# Patient Record
Sex: Female | Born: 1940 | Race: White | Hispanic: No | Marital: Married | Smoking: Never smoker
Health system: Southern US, Community
[De-identification: ages and names within clinical notes are randomized; demographics above are authoritative.]

## PROBLEM LIST (undated history)

## (undated) DIAGNOSIS — C801 Malignant (primary) neoplasm, unspecified: Secondary | ICD-10-CM

## (undated) DIAGNOSIS — K219 Gastro-esophageal reflux disease without esophagitis: Secondary | ICD-10-CM

## (undated) DIAGNOSIS — E039 Hypothyroidism, unspecified: Secondary | ICD-10-CM

## (undated) DIAGNOSIS — I10 Essential (primary) hypertension: Secondary | ICD-10-CM

## (undated) DIAGNOSIS — Z8719 Personal history of other diseases of the digestive system: Secondary | ICD-10-CM

## (undated) DIAGNOSIS — Z9889 Other specified postprocedural states: Secondary | ICD-10-CM

## (undated) DIAGNOSIS — E785 Hyperlipidemia, unspecified: Secondary | ICD-10-CM

## (undated) DIAGNOSIS — R7303 Prediabetes: Secondary | ICD-10-CM

## (undated) DIAGNOSIS — J189 Pneumonia, unspecified organism: Secondary | ICD-10-CM

## (undated) DIAGNOSIS — R112 Nausea with vomiting, unspecified: Secondary | ICD-10-CM

## (undated) DIAGNOSIS — M48061 Spinal stenosis, lumbar region without neurogenic claudication: Secondary | ICD-10-CM

## (undated) DIAGNOSIS — R059 Cough, unspecified: Secondary | ICD-10-CM

## (undated) DIAGNOSIS — R05 Cough: Secondary | ICD-10-CM

## (undated) DIAGNOSIS — I82409 Acute embolism and thrombosis of unspecified deep veins of unspecified lower extremity: Secondary | ICD-10-CM

## (undated) DIAGNOSIS — M1990 Unspecified osteoarthritis, unspecified site: Secondary | ICD-10-CM

## (undated) DIAGNOSIS — N390 Urinary tract infection, site not specified: Secondary | ICD-10-CM

## (undated) HISTORY — PX: BREAST BIOPSY: SHX20

## (undated) HISTORY — PX: BREAST SURGERY: SHX581

## (undated) HISTORY — PX: APPENDECTOMY: SHX54

## (undated) HISTORY — PX: JOINT REPLACEMENT: SHX530

## (undated) HISTORY — PX: EYE SURGERY: SHX253

## (undated) HISTORY — PX: DILATION AND CURETTAGE OF UTERUS: SHX78

## (undated) HISTORY — PX: ABDOMINAL HYSTERECTOMY: SHX81

## (undated) HISTORY — PX: COLONOSCOPY: SHX174

---

## 2004-11-23 ENCOUNTER — Ambulatory Visit: Payer: Self-pay | Admitting: Internal Medicine

## 2005-02-10 ENCOUNTER — Ambulatory Visit: Payer: Self-pay | Admitting: Internal Medicine

## 2006-03-01 ENCOUNTER — Ambulatory Visit: Payer: Self-pay | Admitting: Internal Medicine

## 2007-03-13 ENCOUNTER — Ambulatory Visit: Payer: Self-pay | Admitting: Internal Medicine

## 2008-03-17 ENCOUNTER — Ambulatory Visit: Payer: Self-pay | Admitting: Internal Medicine

## 2008-04-07 ENCOUNTER — Ambulatory Visit: Payer: Self-pay | Admitting: Gastroenterology

## 2009-04-09 ENCOUNTER — Ambulatory Visit: Payer: Self-pay | Admitting: Internal Medicine

## 2010-04-12 ENCOUNTER — Ambulatory Visit: Payer: Self-pay | Admitting: Internal Medicine

## 2011-06-22 ENCOUNTER — Ambulatory Visit: Payer: Self-pay | Admitting: Internal Medicine

## 2012-06-24 ENCOUNTER — Ambulatory Visit: Payer: Self-pay | Admitting: Internal Medicine

## 2013-06-27 ENCOUNTER — Ambulatory Visit: Payer: Self-pay | Admitting: Internal Medicine

## 2014-06-03 ENCOUNTER — Other Ambulatory Visit: Payer: Self-pay | Admitting: Physical Medicine and Rehabilitation

## 2014-06-03 DIAGNOSIS — M5116 Intervertebral disc disorders with radiculopathy, lumbar region: Secondary | ICD-10-CM | POA: Insufficient documentation

## 2014-06-03 DIAGNOSIS — M5136 Other intervertebral disc degeneration, lumbar region: Secondary | ICD-10-CM | POA: Insufficient documentation

## 2014-06-03 DIAGNOSIS — M5416 Radiculopathy, lumbar region: Secondary | ICD-10-CM

## 2014-06-12 ENCOUNTER — Other Ambulatory Visit: Payer: Self-pay

## 2014-06-17 ENCOUNTER — Ambulatory Visit
Admission: RE | Admit: 2014-06-17 | Discharge: 2014-06-17 | Disposition: A | Discharge status: Home / Self Care | Payer: 59 | Source: Ambulatory Visit | Attending: Physical Medicine and Rehabilitation | Admitting: Physical Medicine and Rehabilitation

## 2014-06-17 DIAGNOSIS — M5416 Radiculopathy, lumbar region: Secondary | ICD-10-CM

## 2014-06-23 DIAGNOSIS — M48061 Spinal stenosis, lumbar region without neurogenic claudication: Secondary | ICD-10-CM | POA: Insufficient documentation

## 2014-08-03 ENCOUNTER — Ambulatory Visit: Payer: Self-pay | Admitting: Internal Medicine

## 2014-08-31 ENCOUNTER — Other Ambulatory Visit: Payer: Self-pay | Admitting: Neurosurgery

## 2014-08-31 DIAGNOSIS — M5416 Radiculopathy, lumbar region: Secondary | ICD-10-CM

## 2014-09-03 ENCOUNTER — Ambulatory Visit
Admission: RE | Admit: 2014-09-03 | Discharge: 2014-09-03 | Disposition: A | Discharge status: Home / Self Care | Payer: PRIVATE HEALTH INSURANCE | Source: Ambulatory Visit | Attending: Neurosurgery | Admitting: Neurosurgery

## 2014-09-03 DIAGNOSIS — M5416 Radiculopathy, lumbar region: Secondary | ICD-10-CM

## 2014-09-03 MED ORDER — MEPERIDINE HCL 100 MG/ML IJ SOLN
50.0000 mg | Freq: Once | INTRAMUSCULAR | Status: AC
  Administered 2014-09-03: 50 mg via INTRAMUSCULAR

## 2014-09-03 MED ORDER — IOHEXOL 180 MG/ML  SOLN
15.0000 mL | Freq: Once | INTRAMUSCULAR | Status: AC | PRN
  Administered 2014-09-03: 15 mL via INTRATHECAL

## 2014-09-03 MED ORDER — ONDANSETRON HCL 4 MG/2ML IJ SOLN
4.0000 mg | Freq: Once | INTRAMUSCULAR | Status: AC
  Administered 2014-09-03: 4 mg via INTRAMUSCULAR

## 2014-09-03 MED ORDER — DIAZEPAM 5 MG PO TABS
5.0000 mg | ORAL_TABLET | Freq: Once | ORAL | Status: AC
  Administered 2014-09-03: 5 mg via ORAL

## 2014-09-03 NOTE — Discharge Instructions (Signed)

## 2014-09-04 ENCOUNTER — Telehealth: Payer: Self-pay | Admitting: Radiology

## 2014-09-04 NOTE — Telephone Encounter (Signed)
Pt had myelo yesterday and wanted to know if it was all right for her to be up and doing as long as she didn't have a headache. Also, wanted to know if it was okay to remove her bandaid.  I told her yes on both counts.

## 2014-09-14 ENCOUNTER — Other Ambulatory Visit: Payer: Self-pay | Admitting: Neurosurgery

## 2014-10-07 ENCOUNTER — Other Ambulatory Visit (HOSPITAL_COMMUNITY): Payer: Self-pay | Admitting: *Deleted

## 2014-10-07 ENCOUNTER — Encounter (HOSPITAL_COMMUNITY)
Admission: RE | Admit: 2014-10-07 | Discharge: 2014-10-07 | Disposition: A | Discharge status: Home / Self Care | Payer: Medicare Other | Source: Ambulatory Visit | Attending: Neurosurgery | Admitting: Neurosurgery

## 2014-10-07 ENCOUNTER — Encounter (HOSPITAL_COMMUNITY): Payer: Self-pay

## 2014-10-07 DIAGNOSIS — R001 Bradycardia, unspecified: Secondary | ICD-10-CM | POA: Diagnosis not present

## 2014-10-07 DIAGNOSIS — Z86718 Personal history of other venous thrombosis and embolism: Secondary | ICD-10-CM | POA: Insufficient documentation

## 2014-10-07 DIAGNOSIS — R9431 Abnormal electrocardiogram [ECG] [EKG]: Secondary | ICD-10-CM | POA: Insufficient documentation

## 2014-10-07 DIAGNOSIS — M4800 Spinal stenosis, site unspecified: Secondary | ICD-10-CM | POA: Diagnosis not present

## 2014-10-07 DIAGNOSIS — Z01812 Encounter for preprocedural laboratory examination: Secondary | ICD-10-CM | POA: Insufficient documentation

## 2014-10-07 DIAGNOSIS — I10 Essential (primary) hypertension: Secondary | ICD-10-CM | POA: Diagnosis not present

## 2014-10-07 DIAGNOSIS — E785 Hyperlipidemia, unspecified: Secondary | ICD-10-CM | POA: Diagnosis not present

## 2014-10-07 DIAGNOSIS — E039 Hypothyroidism, unspecified: Secondary | ICD-10-CM | POA: Insufficient documentation

## 2014-10-07 HISTORY — DX: Acute embolism and thrombosis of unspecified deep veins of unspecified lower extremity: I82.409

## 2014-10-07 HISTORY — DX: Malignant (primary) neoplasm, unspecified: C80.1

## 2014-10-07 HISTORY — DX: Hyperlipidemia, unspecified: E78.5

## 2014-10-07 HISTORY — DX: Unspecified osteoarthritis, unspecified site: M19.90

## 2014-10-07 HISTORY — DX: Other specified postprocedural states: R11.2

## 2014-10-07 HISTORY — DX: Other specified postprocedural states: Z98.890

## 2014-10-07 HISTORY — DX: Essential (primary) hypertension: I10

## 2014-10-07 HISTORY — DX: Urinary tract infection, site not specified: N39.0

## 2014-10-07 HISTORY — DX: Hypothyroidism, unspecified: E03.9

## 2014-10-07 LAB — BASIC METABOLIC PANEL
ANION GAP: 10 (ref 5–15)
BUN: 19 mg/dL (ref 6–23)
CALCIUM: 9.6 mg/dL (ref 8.4–10.5)
CHLORIDE: 106 mmol/L (ref 96–112)
CO2: 25 mmol/L (ref 19–32)
CREATININE: 0.67 mg/dL (ref 0.50–1.10)
GFR calc non Af Amer: 85 mL/min — ABNORMAL LOW (ref >90)
Glucose, Bld: 99 mg/dL (ref 70–99)
Potassium: 4.2 mmol/L (ref 3.5–5.1)
Sodium: 141 mmol/L (ref 135–145)

## 2014-10-07 LAB — CBC
HCT: 38.3 % (ref 36.0–46.0)
Hemoglobin: 12.6 g/dL (ref 12.0–15.0)
MCH: 31.4 pg (ref 26.0–34.0)
MCHC: 32.9 g/dL (ref 30.0–36.0)
MCV: 95.5 fL (ref 78.0–100.0)
PLATELETS: 255 10*3/uL (ref 150–400)
RBC: 4.01 MIL/uL (ref 3.87–5.11)
RDW: 12.3 % (ref 11.5–15.5)
WBC: 8.9 10*3/uL (ref 4.0–10.5)

## 2014-10-07 LAB — SURGICAL PCR SCREEN
MRSA, PCR: NEGATIVE
STAPHYLOCOCCUS AUREUS: NEGATIVE

## 2014-10-07 NOTE — Pre-Procedure Instructions (Signed)
KYLINN SHROPSHIRE  10/07/2014   Your procedure is scheduled on:  Thursday, October 15, 2014 at 7:30 AM.   Report to [facility] Entrance "A" Admitting Office at 5:30 AM.   Call this number if you have problems the morning of surgery: [phone]               Any questions prior to day of surgery, please call [phone] between 8 & 4 PM.   Remember:   Do not eat food or drink liquids after midnight Wednesday, 10/14/14.   Take these medicines the morning of surgery with A SIP OF WATER: Gabapentin (Neurontin), Levothyroxine (Synthroid), Hydrocodone (Vicodin) - if needed.  Stop Aspirin, Fish oil, Herbal Medications and all Vitamins as of today. Do not use NSAIDS (Ibuprofen, Aleve, etc) prior to surgery.   Do not wear jewelry, make-up or nail polish.  Do not wear lotions, powders, or perfumes. You may wear deodorant.  Do not shave 48 hours prior to surgery.   Do not bring valuables to the hospital.  [facility] is not responsible                  for any belongings or valuables.               Contacts, dentures or bridgework may not be worn into surgery.  Leave suitcase in the car. After surgery it may be brought to your room.  For patients admitted to the hospital, discharge time is determined by your                treatment team.               Patients discharged the day of surgery will not be allowed to drive home.    Special Instructions: [place] - Preparing for Surgery  Before surgery, you can play an important role.  Because skin is not sterile, your skin needs to be as free of germs as possible.  You can reduce the number of germs on you skin by washing with CHG (chlorahexidine gluconate) soap before surgery.  CHG is an antiseptic cleaner which kills germs and bonds with the skin to continue killing germs even after washing.  Please DO NOT use if you have an allergy to CHG or antibacterial soaps.  If your skin becomes reddened/irritated stop using the CHG and inform  your nurse when you arrive at Short Stay.  Do not shave (including legs and underarms) for at least 48 hours prior to the first CHG shower.  You may shave your face.  Please follow these instructions carefully:   1.  Shower with CHG Soap the night before surgery and the                                morning of Surgery.  2.  If you choose to wash your hair, wash your hair first as usual with your       normal shampoo.  3.  After you shampoo, rinse your hair and body thoroughly to remove the                      Shampoo.  4.  Use CHG as you would any other liquid soap.  You can apply chg directly       to the skin and wash gently with scrungie or a clean washcloth.  5.  Apply the  CHG Soap to your body ONLY FROM THE NECK DOWN.        Do not use on open wounds or open sores.  Avoid contact with your eyes, ears, mouth and genitals (private parts).  Wash genitals (private parts) with your normal soap.  6.  Wash thoroughly, paying special attention to the area where your surgery        will be performed.  7.  Thoroughly rinse your body with warm water from the neck down.  8.  DO NOT shower/wash with your normal soap after using and rinsing off       the CHG Soap.  9.  Pat yourself dry with a clean towel.            10.  Wear clean pajamas.            11.  Place clean sheets on your bed the night of your first shower and do not        sleep with pets.  Day of Surgery  Do not apply any lotions the morning of surgery.  Please wear clean clothes to the hospital.     Please read over the following fact sheets that you were given: Pain Booklet, Coughing and Deep Breathing, MRSA Information and Surgical Site Infection Prevention

## 2014-10-07 NOTE — Progress Notes (Signed)
Pt denies cardiac history. She denies sob or chest pain.   Called Dr. Guerry Bruin office for an old EKG, none available.

## 2014-10-08 NOTE — Progress Notes (Signed)
Anesthesia Chart Review:  Pt is 74 year old female scheduled for L2-3, L3-4 laminectomy and foraminotomy on 10/15/2014 with Dr. Hal Neer.   PCP is Dr. Rosario Jacks in [place], [place]  PMH includes: HTN, hyperlipidemia, DVT, hypothyroidism.   Preoperative labs reviewed.    EKG: Sinus bradycardia (58 bpm). Left axis deviation. Low voltage QRS. Cannot rule out Anterior infarct , age undetermined. No previous tracing available in our system or from PCP. Pt denies cardiac history and any SOB or CP at PAT appointment.   If no changes, I anticipate pt can proceed with surgery as scheduled.   Willeen Cass, FNP-BC [facility]/Anesthesiology Phone: [phone] 10/08/2014 5:01 PM

## 2014-10-14 MED ORDER — DEXAMETHASONE SODIUM PHOSPHATE 10 MG/ML IJ SOLN
10.0000 mg | INTRAMUSCULAR | Status: AC
  Administered 2014-10-15: 10 mg via INTRAVENOUS
  Filled 2014-10-14: qty 1

## 2014-10-14 MED ORDER — CEFAZOLIN SODIUM-DEXTROSE 2-3 GM-% IV SOLR
2.0000 g | INTRAVENOUS | Status: AC
  Administered 2014-10-15: 2 g via INTRAVENOUS
  Filled 2014-10-14: qty 50

## 2014-10-15 ENCOUNTER — Inpatient Hospital Stay (HOSPITAL_COMMUNITY): Payer: Medicare Other

## 2014-10-15 ENCOUNTER — Encounter (HOSPITAL_COMMUNITY): Payer: Self-pay | Admitting: *Deleted

## 2014-10-15 ENCOUNTER — Inpatient Hospital Stay (HOSPITAL_COMMUNITY): Payer: Medicare Other | Admitting: Anesthesiology

## 2014-10-15 ENCOUNTER — Inpatient Hospital Stay (HOSPITAL_COMMUNITY): Payer: Medicare Other | Admitting: Emergency Medicine

## 2014-10-15 ENCOUNTER — Encounter (HOSPITAL_COMMUNITY)
Admission: RE | Disposition: A | Discharge status: Home / Self Care | Payer: Self-pay | Source: Ambulatory Visit | Attending: Neurosurgery

## 2014-10-15 ENCOUNTER — Inpatient Hospital Stay (HOSPITAL_COMMUNITY)
Admission: RE | Admit: 2014-10-15 | Discharge: 2014-10-16 | DRG: 517 | Disposition: A | Discharge status: Home / Self Care | Payer: Medicare Other | Source: Ambulatory Visit | Attending: Neurosurgery | Admitting: Neurosurgery

## 2014-10-15 DIAGNOSIS — Z7982 Long term (current) use of aspirin: Secondary | ICD-10-CM

## 2014-10-15 DIAGNOSIS — Z90710 Acquired absence of both cervix and uterus: Secondary | ICD-10-CM

## 2014-10-15 DIAGNOSIS — Z85828 Personal history of other malignant neoplasm of skin: Secondary | ICD-10-CM

## 2014-10-15 DIAGNOSIS — M47896 Other spondylosis, lumbar region: Principal | ICD-10-CM | POA: Diagnosis present

## 2014-10-15 DIAGNOSIS — M47816 Spondylosis without myelopathy or radiculopathy, lumbar region: Secondary | ICD-10-CM | POA: Diagnosis present

## 2014-10-15 DIAGNOSIS — M4806 Spinal stenosis, lumbar region: Secondary | ICD-10-CM | POA: Diagnosis present

## 2014-10-15 DIAGNOSIS — I10 Essential (primary) hypertension: Secondary | ICD-10-CM | POA: Diagnosis present

## 2014-10-15 DIAGNOSIS — E039 Hypothyroidism, unspecified: Secondary | ICD-10-CM | POA: Diagnosis present

## 2014-10-15 DIAGNOSIS — M1990 Unspecified osteoarthritis, unspecified site: Secondary | ICD-10-CM | POA: Diagnosis present

## 2014-10-15 DIAGNOSIS — Z79899 Other long term (current) drug therapy: Secondary | ICD-10-CM | POA: Diagnosis not present

## 2014-10-15 DIAGNOSIS — Z87440 Personal history of urinary (tract) infections: Secondary | ICD-10-CM

## 2014-10-15 DIAGNOSIS — Z86718 Personal history of other venous thrombosis and embolism: Secondary | ICD-10-CM | POA: Diagnosis not present

## 2014-10-15 DIAGNOSIS — Z91048 Other nonmedicinal substance allergy status: Secondary | ICD-10-CM

## 2014-10-15 DIAGNOSIS — E785 Hyperlipidemia, unspecified: Secondary | ICD-10-CM | POA: Diagnosis present

## 2014-10-15 DIAGNOSIS — M48061 Spinal stenosis, lumbar region without neurogenic claudication: Secondary | ICD-10-CM

## 2014-10-15 HISTORY — PX: LUMBAR LAMINECTOMY/DECOMPRESSION MICRODISCECTOMY: SHX5026

## 2014-10-15 SURGERY — LUMBAR LAMINECTOMY/DECOMPRESSION MICRODISCECTOMY 2 LEVELS
Anesthesia: General | Site: Back | Laterality: Right

## 2014-10-15 MED ORDER — PANTOPRAZOLE SODIUM 40 MG IV SOLR
40.0000 mg | Freq: Every day | INTRAVENOUS | Status: DC
  Filled 2014-10-15: qty 40

## 2014-10-15 MED ORDER — 0.9 % SODIUM CHLORIDE (POUR BTL) OPTIME
TOPICAL | Status: DC | PRN
  Administered 2014-10-15: 1000 mL

## 2014-10-15 MED ORDER — LACTATED RINGERS IV SOLN
INTRAVENOUS | Status: DC | PRN
Start: 2014-10-15 — End: 2014-10-15
  Administered 2014-10-15 (×2): via INTRAVENOUS

## 2014-10-15 MED ORDER — SODIUM CHLORIDE 0.9 % IR SOLN
Status: DC | PRN
  Administered 2014-10-15: 500 mL

## 2014-10-15 MED ORDER — ONDANSETRON HCL 4 MG/2ML IJ SOLN: 4.0000 mg | INTRAMUSCULAR | Status: DC | PRN

## 2014-10-15 MED ORDER — LOSARTAN POTASSIUM 50 MG PO TABS
100.0000 mg | ORAL_TABLET | Freq: Every day | ORAL | Status: DC
  Administered 2014-10-15: 100 mg via ORAL
  Filled 2014-10-15 (×2): qty 2

## 2014-10-15 MED ORDER — GLYCOPYRROLATE 0.2 MG/ML IJ SOLN
INTRAMUSCULAR | Status: AC
  Filled 2014-10-15: qty 2

## 2014-10-15 MED ORDER — DEXAMETHASONE SODIUM PHOSPHATE 4 MG/ML IJ SOLN: 4.0000 mg | Freq: Four times a day (QID) | INTRAMUSCULAR | Status: AC

## 2014-10-15 MED ORDER — METHOCARBAMOL 500 MG PO TABS
500.0000 mg | ORAL_TABLET | Freq: Four times a day (QID) | ORAL | Status: DC | PRN
  Administered 2014-10-15: 500 mg via ORAL

## 2014-10-15 MED ORDER — CEFAZOLIN SODIUM 1-5 GM-% IV SOLN
1.0000 g | Freq: Three times a day (TID) | INTRAVENOUS | Status: AC
  Administered 2014-10-15 (×2): 1 g via INTRAVENOUS
  Filled 2014-10-15 (×2): qty 50

## 2014-10-15 MED ORDER — KCL IN DEXTROSE-NACL 20-5-0.45 MEQ/L-%-% IV SOLN
80.0000 mL/h | INTRAVENOUS | Status: DC
  Filled 2014-10-15 (×3): qty 1000

## 2014-10-15 MED ORDER — LIDOCAINE HCL (CARDIAC) 20 MG/ML IV SOLN
INTRAVENOUS | Status: AC
  Filled 2014-10-15: qty 5

## 2014-10-15 MED ORDER — ROCURONIUM BROMIDE 50 MG/5ML IV SOLN
INTRAVENOUS | Status: AC
  Filled 2014-10-15: qty 1

## 2014-10-15 MED ORDER — PHENYLEPHRINE 40 MCG/ML (10ML) SYRINGE FOR IV PUSH (FOR BLOOD PRESSURE SUPPORT)
PREFILLED_SYRINGE | INTRAVENOUS | Status: AC
  Filled 2014-10-15: qty 10

## 2014-10-15 MED ORDER — SODIUM CHLORIDE 0.9 % IJ SOLN: 3.0000 mL | INTRAMUSCULAR | Status: DC | PRN

## 2014-10-15 MED ORDER — LOSARTAN POTASSIUM-HCTZ 100-25 MG PO TABS: 1.0000 | ORAL_TABLET | Freq: Every day | ORAL | Status: DC

## 2014-10-15 MED ORDER — PHENOL 1.4 % MT LIQD: 1.0000 | OROMUCOSAL | Status: DC | PRN

## 2014-10-15 MED ORDER — NEOSTIGMINE METHYLSULFATE 10 MG/10ML IV SOLN
INTRAVENOUS | Status: DC | PRN
  Administered 2014-10-15: 3 mg via INTRAVENOUS

## 2014-10-15 MED ORDER — GABAPENTIN 300 MG PO CAPS
600.0000 mg | ORAL_CAPSULE | Freq: Two times a day (BID) | ORAL | Status: DC
  Administered 2014-10-15: 600 mg via ORAL
  Filled 2014-10-15 (×3): qty 2

## 2014-10-15 MED ORDER — METHOCARBAMOL 1000 MG/10ML IJ SOLN
500.0000 mg | Freq: Four times a day (QID) | INTRAVENOUS | Status: DC | PRN
  Filled 2014-10-15: qty 5

## 2014-10-15 MED ORDER — HEMOSTATIC AGENTS (NO CHARGE) OPTIME
TOPICAL | Status: DC | PRN
  Administered 2014-10-15: 1 via TOPICAL

## 2014-10-15 MED ORDER — ACETAMINOPHEN 325 MG PO TABS: 650.0000 mg | ORAL_TABLET | ORAL | Status: DC | PRN

## 2014-10-15 MED ORDER — KETOROLAC TROMETHAMINE 30 MG/ML IJ SOLN
15.0000 mg | Freq: Four times a day (QID) | INTRAMUSCULAR | Status: DC
  Administered 2014-10-15 – 2014-10-16 (×3): 15 mg via INTRAVENOUS
  Filled 2014-10-15 (×7): qty 1

## 2014-10-15 MED ORDER — FENTANYL CITRATE 0.05 MG/ML IJ SOLN
INTRAMUSCULAR | Status: DC | PRN
  Administered 2014-10-15: 50 ug via INTRAVENOUS
  Administered 2014-10-15: 100 ug via INTRAVENOUS

## 2014-10-15 MED ORDER — SODIUM CHLORIDE 0.9 % IJ SOLN
3.0000 mL | Freq: Two times a day (BID) | INTRAMUSCULAR | Status: DC
  Administered 2014-10-15: 3 mL via INTRAVENOUS

## 2014-10-15 MED ORDER — ROCURONIUM BROMIDE 100 MG/10ML IV SOLN
INTRAVENOUS | Status: DC | PRN
Start: 2014-10-15 — End: 2014-10-15
  Administered 2014-10-15: 50 mg via INTRAVENOUS

## 2014-10-15 MED ORDER — KETOROLAC TROMETHAMINE 30 MG/ML IJ SOLN
INTRAMUSCULAR | Status: AC
  Filled 2014-10-15: qty 1

## 2014-10-15 MED ORDER — PROPOFOL 10 MG/ML IV BOLUS
INTRAVENOUS | Status: AC
  Filled 2014-10-15: qty 20

## 2014-10-15 MED ORDER — MENTHOL 3 MG MT LOZG
1.0000 | LOZENGE | OROMUCOSAL | Status: DC | PRN
  Filled 2014-10-15: qty 9

## 2014-10-15 MED ORDER — KETOROLAC TROMETHAMINE 30 MG/ML IJ SOLN
INTRAMUSCULAR | Status: DC | PRN
  Administered 2014-10-15: 15 mg via INTRAVENOUS

## 2014-10-15 MED ORDER — THROMBIN 5000 UNITS EX SOLR
CUTANEOUS | Status: DC | PRN
Start: 2014-10-15 — End: 2014-10-15
  Administered 2014-10-15 (×2): 5000 [IU] via TOPICAL

## 2014-10-15 MED ORDER — PROPOFOL 10 MG/ML IV BOLUS
INTRAVENOUS | Status: DC | PRN
  Administered 2014-10-15: 90 mg via INTRAVENOUS

## 2014-10-15 MED ORDER — HYDROCODONE-ACETAMINOPHEN 5-325 MG PO TABS
1.0000 | ORAL_TABLET | ORAL | Status: DC | PRN
  Administered 2014-10-15: 2 via ORAL
  Filled 2014-10-15: qty 2

## 2014-10-15 MED ORDER — METHOCARBAMOL 500 MG PO TABS
ORAL_TABLET | ORAL | Status: AC
Start: 2014-10-15 — End: 2014-10-15
  Filled 2014-10-15: qty 1

## 2014-10-15 MED ORDER — ACETAMINOPHEN 650 MG RE SUPP: 650.0000 mg | RECTAL | Status: DC | PRN

## 2014-10-15 MED ORDER — EPHEDRINE SULFATE 50 MG/ML IJ SOLN
INTRAMUSCULAR | Status: AC
  Filled 2014-10-15: qty 1

## 2014-10-15 MED ORDER — LIDOCAINE HCL (CARDIAC) 20 MG/ML IV SOLN
INTRAVENOUS | Status: DC | PRN
  Administered 2014-10-15: 40 mg via INTRAVENOUS

## 2014-10-15 MED ORDER — ATORVASTATIN CALCIUM 40 MG PO TABS
40.0000 mg | ORAL_TABLET | Freq: Every day | ORAL | Status: DC
  Administered 2014-10-15: 40 mg via ORAL
  Filled 2014-10-15 (×2): qty 1

## 2014-10-15 MED ORDER — HYDROCHLOROTHIAZIDE 25 MG PO TABS
25.0000 mg | ORAL_TABLET | Freq: Every day | ORAL | Status: DC
  Administered 2014-10-15: 25 mg via ORAL
  Filled 2014-10-15 (×2): qty 1

## 2014-10-15 MED ORDER — SODIUM CHLORIDE 0.9 % IV SOLN: 250.0000 mL | INTRAVENOUS | Status: DC

## 2014-10-15 MED ORDER — FENTANYL CITRATE 0.05 MG/ML IJ SOLN
INTRAMUSCULAR | Status: AC
  Filled 2014-10-15: qty 5

## 2014-10-15 MED ORDER — PANTOPRAZOLE SODIUM 40 MG PO TBEC
40.0000 mg | DELAYED_RELEASE_TABLET | Freq: Every day | ORAL | Status: DC
  Administered 2014-10-15: 40 mg via ORAL
  Filled 2014-10-15: qty 1

## 2014-10-15 MED ORDER — GLYCOPYRROLATE 0.2 MG/ML IJ SOLN
INTRAMUSCULAR | Status: DC | PRN
  Administered 2014-10-15: 0.4 mg via INTRAVENOUS

## 2014-10-15 MED ORDER — MIDAZOLAM HCL 2 MG/2ML IJ SOLN
INTRAMUSCULAR | Status: AC
  Filled 2014-10-15: qty 2

## 2014-10-15 MED ORDER — MIDAZOLAM HCL 5 MG/5ML IJ SOLN
INTRAMUSCULAR | Status: DC | PRN
  Administered 2014-10-15: 2 mg via INTRAVENOUS

## 2014-10-15 MED ORDER — DEXAMETHASONE 4 MG PO TABS
4.0000 mg | ORAL_TABLET | Freq: Four times a day (QID) | ORAL | Status: AC
  Administered 2014-10-15 (×2): 4 mg via ORAL
  Filled 2014-10-15: qty 1

## 2014-10-15 MED ORDER — ONDANSETRON HCL 4 MG/2ML IJ SOLN
INTRAMUSCULAR | Status: DC | PRN
  Administered 2014-10-15: 4 mg via INTRAVENOUS

## 2014-10-15 MED ORDER — HYDROMORPHONE HCL 1 MG/ML IJ SOLN
INTRAMUSCULAR | Status: AC
  Administered 2014-10-15: 0.5 mg via INTRAVENOUS
  Filled 2014-10-15: qty 1

## 2014-10-15 MED ORDER — LEVOTHYROXINE SODIUM 88 MCG PO TABS
88.0000 ug | ORAL_TABLET | Freq: Every day | ORAL | Status: DC
  Administered 2014-10-16: 88 ug via ORAL
  Filled 2014-10-15 (×2): qty 1

## 2014-10-15 MED ORDER — NEOSTIGMINE METHYLSULFATE 10 MG/10ML IV SOLN
INTRAVENOUS | Status: AC
  Filled 2014-10-15: qty 1

## 2014-10-15 MED ORDER — SUCCINYLCHOLINE CHLORIDE 20 MG/ML IJ SOLN
INTRAMUSCULAR | Status: AC
  Filled 2014-10-15: qty 1

## 2014-10-15 MED ORDER — DOCUSATE SODIUM 100 MG PO CAPS
100.0000 mg | ORAL_CAPSULE | Freq: Two times a day (BID) | ORAL | Status: DC
  Administered 2014-10-15: 100 mg via ORAL
  Filled 2014-10-15: qty 1

## 2014-10-15 MED ORDER — ONDANSETRON HCL 4 MG/2ML IJ SOLN
INTRAMUSCULAR | Status: AC
  Filled 2014-10-15: qty 2

## 2014-10-15 MED ORDER — SODIUM CHLORIDE 0.9 % IJ SOLN
INTRAMUSCULAR | Status: AC
  Filled 2014-10-15: qty 10

## 2014-10-15 MED ORDER — HYDROMORPHONE HCL 1 MG/ML IJ SOLN
0.2500 mg | INTRAMUSCULAR | Status: DC | PRN
  Administered 2014-10-15 (×3): 0.5 mg via INTRAVENOUS

## 2014-10-15 MED ORDER — HYDROMORPHONE HCL 1 MG/ML IJ SOLN: 1.0000 mg | INTRAMUSCULAR | Status: DC | PRN

## 2014-10-15 SURGICAL SUPPLY — 46 items
BAG DECANTER FOR FLEXI CONT (MISCELLANEOUS) ×3 IMPLANT
BENZOIN TINCTURE PRP APPL 2/3 (GAUZE/BANDAGES/DRESSINGS) ×3 IMPLANT
BLADE CLIPPER SURG (BLADE) ×3 IMPLANT
BRUSH SCRUB EZ PLAIN DRY (MISCELLANEOUS) ×3 IMPLANT
BUR CUTTER 7.0 ROUND (BURR) ×3 IMPLANT
BUR MATCHSTICK NEURO 3.0 LAGG (BURR) ×3 IMPLANT
CANISTER SUCT 3000ML PPV (MISCELLANEOUS) ×3 IMPLANT
CLOSURE WOUND 1/2 X4 (GAUZE/BANDAGES/DRESSINGS) ×1
CONT SPEC 4OZ CLIKSEAL STRL BL (MISCELLANEOUS) ×3 IMPLANT
DRAPE LAPAROTOMY 100X72X124 (DRAPES) ×3 IMPLANT
DRAPE MICROSCOPE LEICA (MISCELLANEOUS) ×3 IMPLANT
DRAPE SURG 17X23 STRL (DRAPES) ×6 IMPLANT
DRSG OPSITE POSTOP 4X6 (GAUZE/BANDAGES/DRESSINGS) ×3 IMPLANT
DRSG TELFA 3X8 NADH (GAUZE/BANDAGES/DRESSINGS) ×3 IMPLANT
ELECT REM PT RETURN 9FT ADLT (ELECTROSURGICAL) ×3
ELECTRODE REM PT RTRN 9FT ADLT (ELECTROSURGICAL) ×1 IMPLANT
GAUZE SPONGE 4X4 12PLY STRL (GAUZE/BANDAGES/DRESSINGS) ×3 IMPLANT
GAUZE SPONGE 4X4 16PLY XRAY LF (GAUZE/BANDAGES/DRESSINGS) IMPLANT
GLOVE BIOGEL PI IND STRL 8 (GLOVE) ×1 IMPLANT
GLOVE BIOGEL PI INDICATOR 8 (GLOVE) ×2
GLOVE ECLIPSE 7.5 STRL STRAW (GLOVE) ×3 IMPLANT
GLOVE ECLIPSE 8.0 STRL XLNG CF (GLOVE) ×3 IMPLANT
GOWN STRL REUS W/ TWL LRG LVL3 (GOWN DISPOSABLE) ×1 IMPLANT
GOWN STRL REUS W/ TWL XL LVL3 (GOWN DISPOSABLE) ×2 IMPLANT
GOWN STRL REUS W/TWL 2XL LVL3 (GOWN DISPOSABLE) IMPLANT
GOWN STRL REUS W/TWL LRG LVL3 (GOWN DISPOSABLE) ×2
GOWN STRL REUS W/TWL XL LVL3 (GOWN DISPOSABLE) ×4
KIT BASIN OR (CUSTOM PROCEDURE TRAY) ×3 IMPLANT
KIT ROOM TURNOVER OR (KITS) ×3 IMPLANT
LIQUID BAND (GAUZE/BANDAGES/DRESSINGS) ×3 IMPLANT
NEEDLE HYPO 22GX1.5 SAFETY (NEEDLE) ×3 IMPLANT
NEEDLE SPNL 22GX3.5 QUINCKE BK (NEEDLE) ×6 IMPLANT
NS IRRIG 1000ML POUR BTL (IV SOLUTION) ×3 IMPLANT
PACK LAMINECTOMY NEURO (CUSTOM PROCEDURE TRAY) ×3 IMPLANT
PAD ARMBOARD 7.5X6 YLW CONV (MISCELLANEOUS) ×9 IMPLANT
PATTIES SURGICAL .75X.75 (GAUZE/BANDAGES/DRESSINGS) ×3 IMPLANT
RUBBERBAND STERILE (MISCELLANEOUS) ×6 IMPLANT
SPONGE SURGIFOAM ABS GEL SZ50 (HEMOSTASIS) ×3 IMPLANT
STRIP CLOSURE SKIN 1/2X4 (GAUZE/BANDAGES/DRESSINGS) ×2 IMPLANT
SUT VIC AB 2-0 OS6 18 (SUTURE) ×9 IMPLANT
SUT VIC AB 3-0 CP2 18 (SUTURE) ×3 IMPLANT
SYR 20ML ECCENTRIC (SYRINGE) ×3 IMPLANT
TAPE STRIPS DRAPE STRL (GAUZE/BANDAGES/DRESSINGS) ×3 IMPLANT
TOWEL OR 17X24 6PK STRL BLUE (TOWEL DISPOSABLE) ×3 IMPLANT
TOWEL OR 17X26 10 PK STRL BLUE (TOWEL DISPOSABLE) ×3 IMPLANT
WATER STERILE IRR 1000ML POUR (IV SOLUTION) ×3 IMPLANT

## 2014-10-15 NOTE — Anesthesia Postprocedure Evaluation (Signed)
  Anesthesia Post-op Note  Patient: Latoya Douglas  Procedure(s) Performed: Procedure(s) with comments: Laminectomy and Foraminotomy - L2-L3 - right - L3-L4 - bilateral (Right) - Laminectomy and Foraminotomy - L2-L3 - right - L3-L4 - bilateral  Patient Location: PACU  Anesthesia Type:General  Level of Consciousness: awake and alert   Airway and Oxygen Therapy: Patient Spontanous Breathing  Post-op Pain: mild  Post-op Assessment: Post-op Vital signs reviewed, Patient's Cardiovascular Status Stable and Respiratory Function Stable  Post-op Vital Signs: Reviewed  Filed Vitals:   10/15/14 1043  BP:   Pulse: 52  Temp:   Resp: 15    Complications: No apparent anesthesia complications

## 2014-10-15 NOTE — Op Note (Signed)
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 Localizing x-rays taken prior to incision to identify the appropriate levels. Midline incision was made above the spinous processes of L2-L3 and L4. Using Bovie cutting current the incision was carried on the spinous processes. On the right side, subperiosteal dissection was carried out along the spinous processes lamina facet joint of L2-L3 and L4. On the left side, subperiosteal dissection was then carried out along the lamina and facet joint of L3 for only. Self-retaining retractor was placed for exposure and x-ray showed approach the appropriate levels. Starting the patient's left side at L3-4 generous laminotomy was performed by removing the inferior one half of the L3 lamina the medial one third of the facet joint and the superior one third of the L4 lamina. Residual bone and hypertrophic ligamentum flavum removed in a piecemeal fashion. The L4 nerve root was identified and followed out its foramen until the lateral recess had been well decompressed. We then replaced our retractor on the patient's right side and did similar laminotomies L2-3 and L3-4 on the right. Once again we did generous decompression with good decompression of lateral recess until the L3 and L4 nerve roots were well visualized and tracked out their foramen without difficulty. The disks at both levels were evaluated and found to be bulging and spondylotic and not in need of removal. At this time we inspected in all directions bilaterally for any evidence of residual compression and none could be identified. Irrigation was carried out and any bleeding controlled with upper  coagulation and Gelfoam. The was then closed in multiple layers of Vicryl on the muscle fascia subcutaneous and subcuticular tissues. Dermabond and Steri-Strips were then applied to the skin. A sterile dressing was then applied and the patient was extubated and taken to recovery room in stable condition.

## 2014-10-15 NOTE — H&P (Signed)
Latoya Douglas is an 74 y.o. female.   Chief Complaint: Right hip and thigh pain HPI: The patient is a 74 year old female who is evaluated in the office right hip and thigh pain a 5-6 months duration. There is no inciting event. She started using chiropractic treatments for a few months without improvement. She then saw pain specialist obtained an MRI scan and give her medications and epidural shots mild help only. After evaluation the office the patient's MRI scan was reviewed which shows some spondylosis with lateral recess stenosis. She underwent myelography which confirmed compression at L2-3 and L3-4 on the right as well as L3-4 on the left. After discussing the options the patient requested surgery now comes for right L2-3 and bilateral L3-4 laminotomies for decompression. I've had a long discussion with her regarding all the risks and benefits of surgical intervention. The risks discussed include but are not limited to bleeding infection weakness some as paralysis spinal fluid leak, and death. We've discussed alternative methods of therapy along with the risks and benefits of nonintervention. She's had the opportunity to ask numerous questions and appears to understand. With this information in hand she has requested that we proceed with surgery.  Past Medical History  Diagnosis Date  . Hypothyroidism   . Hyperlipidemia   . Hypertension   . DVT (deep venous thrombosis)     in arm (from IV)  "Years ago"  . Frequent UTI   . Arthritis   . Cancer     skin cancer on forehead/face  . PONV (postoperative nausea and vomiting)     Past Surgical History  Procedure Laterality Date  . Eye surgery Bilateral     cataract surgery with lens implant  . Colonoscopy    . Breast surgery      biopsy - benign  . Appendectomy    . Cesarean section      x2  . Abdominal hysterectomy    . Dilation and curettage of uterus      Family History  Problem Relation Age of Onset  . Heart attack Mother   .  Cancer Father   . Heart attack Brother    Social History:  reports that she has never smoked. She has never used smokeless tobacco. She reports that she does not drink alcohol or use illicit drugs.  Allergies:  Allergies  Allergen Reactions  . Tape Itching    Red and irritated    Medications Prior to Admission  Medication Sig Dispense Refill  . aspirin EC 81 MG tablet Take 81 mg by mouth daily.     Marland Kitchen atorvastatin (LIPITOR) 40 MG tablet Take 40 mg by mouth daily.     . calcium-vitamin D (CALCIUM 500/D) 500-200 MG-UNIT per tablet Take 1 tablet by mouth daily with breakfast.     . ciprofloxacin (CIPRO) 500 MG tablet Take 500 mg by mouth 2 (two) times daily. Started 10/05/14 for possible bladder infection    . gabapentin (NEURONTIN) 300 MG capsule Take 600 mg by mouth 2 (two) times daily.     . Ginkgo Biloba (GINKOBA PO) Take 120 mg by mouth daily.    Marland Kitchen HYDROcodone-acetaminophen (NORCO/VICODIN) 5-325 MG per tablet Take 1 tablet by mouth every 8 (eight) hours as needed for moderate pain.     Marland Kitchen levothyroxine (SYNTHROID, LEVOTHROID) 88 MCG tablet Take 88 mcg by mouth daily before breakfast.     . losartan-hydrochlorothiazide (HYZAAR) 100-25 MG per tablet Take 1 tablet by mouth daily.     Marland Kitchen  Multiple Vitamin (MULTI-VITAMINS) TABS Take 1 tablet by mouth daily.     . Omega-3 Fatty Acids (FISH OIL) 1000 MG CAPS Take 2 capsules by mouth daily.     . vitamin E 400 UNIT capsule Take 400 Units by mouth.       No results found for this or any previous visit (from the past 48 hour(s)). No results found.  Negative except for issues of the history of present illness  Blood pressure 125/49, pulse 76, temperature 97.9 F (36.6 C), temperature source Oral, resp. rate 20, weight 73.483 kg (162 lb), SpO2 100 %.  The patient is awake alert and oriented. She has no facial asymmetry. She has trace ankle jerk reflexes bilaterally. Her strength however is 5 over 5 Assessment/Plan Impression is that of  spondylosis at L2-3 and L3-4. The plan is for a right L2-3 and bilateral L3 for decompression.  Faythe Ghee, MD 10/15/2014, 7:29 AM

## 2014-10-15 NOTE — Anesthesia Procedure Notes (Signed)
Procedure Name: Intubation Date/Time: 10/15/2014 7:49 AM Performed by: Julian Reil Pre-anesthesia Checklist: Patient identified, Emergency Drugs available, Suction available and Patient being monitored Patient Re-evaluated:Patient Re-evaluated prior to inductionOxygen Delivery Method: Circle system utilized Preoxygenation: Pre-oxygenation with 100% oxygen Intubation Type: IV induction Ventilation: Mask ventilation without difficulty Laryngoscope Size: Mac and 4 Grade View: Grade II Tube type: Oral Tube size: 7.0 mm Number of attempts: 1 Airway Equipment and Method: Stylet Placement Confirmation: ETT inserted through vocal cords under direct vision,  positive ETCO2 and breath sounds checked- equal and bilateral Secured at: 20 cm Tube secured with: Tape Dental Injury: Teeth and Oropharynx as per pre-operative assessment

## 2014-10-15 NOTE — Anesthesia Preprocedure Evaluation (Signed)
Anesthesia Evaluation  Patient identified by MRN, date of birth, ID band Patient awake    Reviewed: Allergy & Precautions, H&P , NPO status , Patient's Chart, lab work & pertinent test results  History of Anesthesia Complications (+) PONV  Airway Mallampati: II  TM Distance: >3 FB Neck ROM: Full    Dental no notable dental hx. (+) Teeth Intact, Dental Advisory Given   Pulmonary neg pulmonary ROS,  breath sounds clear to auscultation  Pulmonary exam normal       Cardiovascular hypertension, Pt. on medications Rhythm:Regular Rate:Normal     Neuro/Psych negative neurological ROS  negative psych ROS   GI/Hepatic negative GI ROS, Neg liver ROS,   Endo/Other  Hypothyroidism   Renal/GU negative Renal ROS  negative genitourinary   Musculoskeletal   Abdominal   Peds  Hematology negative hematology ROS (+)   Anesthesia Other Findings   Reproductive/Obstetrics negative OB ROS                             Anesthesia Physical Anesthesia Plan  ASA: II  Anesthesia Plan: General   Post-op Pain Management:    Induction: Intravenous  Airway Management Planned: Oral ETT  Additional Equipment:   Intra-op Plan:   Post-operative Plan: Extubation in OR  Informed Consent: I have reviewed the patients History and Physical, chart, labs and discussed the procedure including the risks, benefits and alternatives for the proposed anesthesia with the patient or authorized representative who has indicated his/her understanding and acceptance.   Dental advisory given  Plan Discussed with: CRNA  Anesthesia Plan Comments:         Anesthesia Quick Evaluation

## 2014-10-15 NOTE — Transfer of Care (Signed)
Immediate Anesthesia Transfer of Care Note  Patient: Latoya Douglas  Procedure(s) Performed: Procedure(s) with comments: Laminectomy and Foraminotomy - L2-L3 - right - L3-L4 - bilateral (Right) - Laminectomy and Foraminotomy - L2-L3 - right - L3-L4 - bilateral  Patient Location: PACU  Anesthesia Type:General  Level of Consciousness: awake, alert , oriented and patient cooperative  Airway & Oxygen Therapy: Patient Spontanous Breathing and Patient connected to nasal cannula oxygen  Post-op Assessment: Report given to RN, Post -op Vital signs reviewed and stable and Patient moving all extremities  Post vital signs: Reviewed and stable  Last Vitals:  Filed Vitals:   10/15/14 0549  BP: 125/49  Pulse: 76  Temp: 36.6 C  Resp: 20    Complications: No apparent anesthesia complications

## 2014-10-16 MED ORDER — HYDROCODONE-ACETAMINOPHEN 5-325 MG PO TABS: 1.0000 | ORAL_TABLET | ORAL | Status: DC | PRN

## 2014-10-16 NOTE — Progress Notes (Signed)
Pt doing well. Pt and husband given D/C instructions with Rx, verbal understanding was provided. Pt's incision was covered with Honeycomb dressing and it had no sign of infection. Pt's IV was removed prior to D/C. Pt D/C'd home via wheelchair @ 1340 per MD order. Pt is stable @ D/C and has no other needs at this time. Holli Humbles, RN

## 2014-10-16 NOTE — Progress Notes (Signed)
Utilization Review Completed.[name] T2/19/2016  

## 2014-10-16 NOTE — Discharge Summary (Signed)
  Physician Discharge Summary  Patient ID: Latoya Douglas MRN: [id number] DOB/AGE: [birth year] 74 y.o.  Admit date: 10/15/2014 Discharge date: 10/16/2014  Admission Diagnoses:  Discharge Diagnoses:  Active Problems:   Lumbar spondylosis   Discharged Condition: good  Hospital Course: Surgery yesterday for lumbar decompression for spondylosis. Did well. Marked improvement in leg pain. Wound clean and dry. Home pod 1, specific instructions given.  Consults: None  Significant Diagnostic Studies: none  Treatments: surgery: R L23, bilateral L 34 decompression.  Discharge Exam: Blood pressure 121/86, pulse 55, temperature 98.2 F (36.8 C), temperature source Oral, resp. rate 16, weight 73.483 kg (162 lb), SpO2 99 %. Incision/Wound:clean and dry; no new neuro issues.  Disposition: Final discharge disposition not confirmed     Medication List    ASK your doctor about these medications        aspirin EC 81 MG tablet  Take 81 mg by mouth daily.     atorvastatin 40 MG tablet  Commonly known as:  LIPITOR  Take 40 mg by mouth daily.     CALCIUM 500/D 500-200 MG-UNIT per tablet  Generic drug:  calcium-vitamin D  Take 1 tablet by mouth daily with breakfast.     ciprofloxacin 500 MG tablet  Commonly known as:  CIPRO  Take 500 mg by mouth 2 (two) times daily. Started 10/05/14 for possible bladder infection     Fish Oil 1000 MG Caps  Take 2 capsules by mouth daily.     gabapentin 300 MG capsule  Commonly known as:  NEURONTIN  Take 600 mg by mouth 2 (two) times daily.     GINKOBA PO  Take 120 mg by mouth daily.     HYDROcodone-acetaminophen 5-325 MG per tablet  Commonly known as:  NORCO/VICODIN  Take 1 tablet by mouth every 8 (eight) hours as needed for moderate pain.     levothyroxine 88 MCG tablet  Commonly known as:  SYNTHROID, LEVOTHROID  Take 88 mcg by mouth daily before breakfast.     losartan-hydrochlorothiazide 100-25 MG per tablet  Commonly known as:  HYZAAR   Take 1 tablet by mouth daily.     MULTI-VITAMINS Tabs  Take 1 tablet by mouth daily.     vitamin E 400 UNIT capsule  Take 400 Units by mouth.         At home rest most of the time. Get up 9 or 10 times each day and take a 15 or 20 minute walk. No riding in the car and to your first postoperative appointment. If you have neck surgery you may shower from the chest down starting on the third postoperative day. If you had back surgery he may start showering on the third postoperative day with saran wrap wrapped around your incisional area 3 times. After the shower remove the saran wrap. Take pain medicine as needed and other medications as instructed. Call my office for an appointment.  Signed: Faythe Ghee, MD 10/16/2014, 1:01 PM

## 2014-10-17 ENCOUNTER — Encounter (HOSPITAL_COMMUNITY): Payer: Self-pay | Admitting: Neurosurgery

## 2015-06-16 ENCOUNTER — Other Ambulatory Visit: Payer: PRIVATE HEALTH INSURANCE

## 2015-06-23 ENCOUNTER — Encounter
Admission: RE | Admit: 2015-06-23 | Discharge: 2015-06-23 | Disposition: A | Discharge status: Home / Self Care | Payer: Medicare Other | Source: Ambulatory Visit | Attending: Orthopedic Surgery | Admitting: Orthopedic Surgery

## 2015-06-23 DIAGNOSIS — Z01818 Encounter for other preprocedural examination: Secondary | ICD-10-CM | POA: Diagnosis not present

## 2015-06-23 DIAGNOSIS — M1611 Unilateral primary osteoarthritis, right hip: Secondary | ICD-10-CM | POA: Diagnosis not present

## 2015-06-23 HISTORY — DX: Cough, unspecified: R05.9

## 2015-06-23 HISTORY — DX: Cough: R05

## 2015-06-23 LAB — URINALYSIS COMPLETE WITH MICROSCOPIC (ARMC ONLY)
BILIRUBIN URINE: NEGATIVE
GLUCOSE, UA: NEGATIVE mg/dL
HGB URINE DIPSTICK: NEGATIVE
KETONES UR: NEGATIVE mg/dL
NITRITE: NEGATIVE
Protein, ur: NEGATIVE mg/dL
SPECIFIC GRAVITY, URINE: 1.017 (ref 1.005–1.030)
Squamous Epithelial / LPF: NONE SEEN
pH: 6 (ref 5.0–8.0)

## 2015-06-23 LAB — BASIC METABOLIC PANEL
Anion gap: 9 (ref 5–15)
BUN: 22 mg/dL — AB (ref 6–20)
CHLORIDE: 104 mmol/L (ref 101–111)
CO2: 27 mmol/L (ref 22–32)
Calcium: 10 mg/dL (ref 8.9–10.3)
Creatinine, Ser: 0.72 mg/dL (ref 0.44–1.00)
GFR calc Af Amer: >60 mL/min (ref >60)
GFR calc non Af Amer: >60 mL/min (ref >60)
GLUCOSE: 86 mg/dL (ref 65–99)
Potassium: 3.9 mmol/L (ref 3.5–5.1)
SODIUM: 140 mmol/L (ref 135–145)

## 2015-06-23 LAB — SURGICAL PCR SCREEN
MRSA, PCR: NEGATIVE
Staphylococcus aureus: NEGATIVE

## 2015-06-23 LAB — CBC
HCT: 37.7 % (ref 35.0–47.0)
HEMOGLOBIN: 12.5 g/dL (ref 12.0–16.0)
MCH: 31.1 pg (ref 26.0–34.0)
MCHC: 33.1 g/dL (ref 32.0–36.0)
MCV: 94.1 fL (ref 80.0–100.0)
Platelets: 312 10*3/uL (ref 150–440)
RBC: 4.01 MIL/uL (ref 3.80–5.20)
RDW: 12.4 % (ref 11.5–14.5)
WBC: 13.1 10*3/uL — ABNORMAL HIGH (ref 3.6–11.0)

## 2015-06-23 LAB — APTT: aPTT: 31 seconds (ref 24–36)

## 2015-06-23 LAB — SEDIMENTATION RATE: Sed Rate: 37 mm/hr — ABNORMAL HIGH (ref 0–30)

## 2015-06-23 LAB — PROTIME-INR
INR: 1.04
Prothrombin Time: 13.8 seconds (ref 11.4–15.0)

## 2015-06-23 LAB — TYPE AND SCREEN
ABO/RH(D): A POS
ANTIBODY SCREEN: NEGATIVE

## 2015-06-23 LAB — ABO/RH: ABO/RH(D): A POS

## 2015-06-23 NOTE — OR Nursing (Signed)
REQUESTED STRESS RESULTS FROM 05/20/15 AT DR Rosario Jacks

## 2015-06-23 NOTE — Patient Instructions (Signed)
  Your procedure is scheduled on: 07/05/15 Report to Day Surgery. [facility]OND FLOOR To find out your arrival time please call [phone] between 1PM - 3PM on 07/02/15  Remember: Instructions that are not followed completely may result in serious medical risk, up to and including death, or upon the discretion of your surgeon and anesthesiologist your surgery may need to be rescheduled.    __X__ 1. Do not eat food or drink liquids after midnight. No gum chewing or hard candies.     __X__ 2. No Alcohol for 24 hours before or after surgery.   ____ 3. Bring all medications with you on the day of surgery if instructed.    _X___ 4. Notify your doctor if there is any change in your medical condition     (cold, fever, infections).     Do not wear jewelry, make-up, hairpins, clips or nail polish.  Do not wear lotions, powders, or perfumes. You may wear deodorant.  Do not shave 48 hours prior to surgery. Men may shave face and neck.  Do not bring valuables to the hospital.    [facility] is not responsible for any belongings or valuables.               Contacts, dentures or bridgework may not be worn into surgery.  Leave your suitcase in the car. After surgery it may be brought to your room.  For patients admitted to the hospital, discharge time is determined by your                treatment team.   Patients discharged the day of surgery will not be allowed to drive home.   Please read over the following fact sheets that you were given:   Surgical Site Infection Prevention   ____ Take these medicines the morning of surgery with A SIP OF WATER:    1. LEVOTHYROXINE  2.   3.   4.  5.  6.  ____ Fleet Enema (as directed)   __X__ Use CHG Soap as directed  ____ Use inhalers on the day of surgery  ____ Stop metformin 2 days prior to surgery    ____ Take 1/2 of usual insulin dose the night before surgery and none on the morning of surgery.   __X__ Stop Coumadin/Plavix/aspirin  on  STOP ASPIRIN 1 WEEK PREOP  _X__ Stop Anti-inflammatories on STOP NAPROXEN I WEEK PREOP  _X__ Stop supplements until after surgery.  STOP GINGKO,FISH OIL, VITAMIN E NOW  ____ Bring C-Pap to the hospital.

## 2015-06-25 LAB — URINE CULTURE: Special Requests: NORMAL

## 2015-06-28 NOTE — OR Nursing (Signed)
Urine culture results called and faxed to Dr Marry Guan office. Spoke with Margaretha Sheffield

## 2015-06-28 NOTE — OR Nursing (Signed)
Negative stress from Dr Rosario Jacks 05/20/15

## 2015-07-05 ENCOUNTER — Inpatient Hospital Stay: Payer: Medicare Other | Admitting: Anesthesiology

## 2015-07-05 ENCOUNTER — Encounter: Payer: Self-pay | Admitting: *Deleted

## 2015-07-05 ENCOUNTER — Encounter
Admission: RE | Disposition: A | Discharge status: Home Health | Payer: Self-pay | Source: Ambulatory Visit | Attending: Orthopedic Surgery

## 2015-07-05 ENCOUNTER — Inpatient Hospital Stay
Admission: RE | Admit: 2015-07-05 | Discharge: 2015-07-07 | DRG: 470 | Disposition: A | Discharge status: Home Health | Payer: Medicare Other | Source: Ambulatory Visit | Attending: Orthopedic Surgery | Admitting: Orthopedic Surgery

## 2015-07-05 ENCOUNTER — Inpatient Hospital Stay: Payer: Medicare Other

## 2015-07-05 DIAGNOSIS — Z96649 Presence of unspecified artificial hip joint: Secondary | ICD-10-CM

## 2015-07-05 DIAGNOSIS — I10 Essential (primary) hypertension: Secondary | ICD-10-CM | POA: Diagnosis present

## 2015-07-05 DIAGNOSIS — Z87440 Personal history of urinary (tract) infections: Secondary | ICD-10-CM | POA: Diagnosis not present

## 2015-07-05 DIAGNOSIS — E785 Hyperlipidemia, unspecified: Secondary | ICD-10-CM | POA: Diagnosis present

## 2015-07-05 DIAGNOSIS — M1611 Unilateral primary osteoarthritis, right hip: Secondary | ICD-10-CM | POA: Diagnosis present

## 2015-07-05 DIAGNOSIS — Z86718 Personal history of other venous thrombosis and embolism: Secondary | ICD-10-CM | POA: Diagnosis not present

## 2015-07-05 DIAGNOSIS — Z7982 Long term (current) use of aspirin: Secondary | ICD-10-CM

## 2015-07-05 DIAGNOSIS — Z79899 Other long term (current) drug therapy: Secondary | ICD-10-CM | POA: Diagnosis not present

## 2015-07-05 DIAGNOSIS — Z85828 Personal history of other malignant neoplasm of skin: Secondary | ICD-10-CM | POA: Diagnosis not present

## 2015-07-05 DIAGNOSIS — E039 Hypothyroidism, unspecified: Secondary | ICD-10-CM | POA: Diagnosis present

## 2015-07-05 HISTORY — PX: TOTAL HIP ARTHROPLASTY: SHX124

## 2015-07-05 SURGERY — ARTHROPLASTY, HIP, TOTAL,POSTERIOR APPROACH
Anesthesia: Spinal | Laterality: Right

## 2015-07-05 MED ORDER — OMEGA-3-ACID ETHYL ESTERS 1 G PO CAPS
1.0000 g | ORAL_CAPSULE | Freq: Every day | ORAL | Status: DC
  Administered 2015-07-05 – 2015-07-07 (×3): 1 g via ORAL
  Filled 2015-07-05 (×3): qty 1

## 2015-07-05 MED ORDER — MORPHINE SULFATE (PF) 2 MG/ML IV SOLN
2.0000 mg | INTRAVENOUS | Status: DC | PRN
  Administered 2015-07-05 (×2): 2 mg via INTRAVENOUS
  Filled 2015-07-05 (×2): qty 1

## 2015-07-05 MED ORDER — LOSARTAN POTASSIUM 50 MG PO TABS
100.0000 mg | ORAL_TABLET | Freq: Every day | ORAL | Status: DC
  Administered 2015-07-06 – 2015-07-07 (×2): 100 mg via ORAL
  Filled 2015-07-05 (×2): qty 2

## 2015-07-05 MED ORDER — DIPHENHYDRAMINE HCL 12.5 MG/5ML PO ELIX
12.5000 mg | ORAL_SOLUTION | ORAL | Status: DC | PRN
  Administered 2015-07-07: 12.5 mg via ORAL
  Filled 2015-07-05: qty 10

## 2015-07-05 MED ORDER — FLEET ENEMA 7-19 GM/118ML RE ENEM: 1.0000 | ENEMA | Freq: Once | RECTAL | Status: DC | PRN

## 2015-07-05 MED ORDER — HYDROCHLOROTHIAZIDE 25 MG PO TABS
25.0000 mg | ORAL_TABLET | Freq: Every day | ORAL | Status: DC
  Administered 2015-07-06 – 2015-07-07 (×2): 25 mg via ORAL
  Filled 2015-07-05 (×2): qty 1

## 2015-07-05 MED ORDER — CEFAZOLIN SODIUM-DEXTROSE 2-3 GM-% IV SOLR
2.0000 g | Freq: Four times a day (QID) | INTRAVENOUS | Status: AC
Start: 2015-07-05 — End: 2015-07-06
  Administered 2015-07-05 – 2015-07-06 (×4): 2 g via INTRAVENOUS
  Filled 2015-07-05 (×5): qty 50

## 2015-07-05 MED ORDER — TRAMADOL HCL 50 MG PO TABS
50.0000 mg | ORAL_TABLET | ORAL | Status: DC | PRN
  Administered 2015-07-06 – 2015-07-07 (×2): 50 mg via ORAL
  Administered 2015-07-07: 100 mg via ORAL
  Filled 2015-07-05: qty 2
  Filled 2015-07-05: qty 1
  Filled 2015-07-05: qty 2

## 2015-07-05 MED ORDER — ONDANSETRON HCL 4 MG/2ML IJ SOLN
INTRAMUSCULAR | Status: DC | PRN
  Administered 2015-07-05: 4 mg via INTRAVENOUS

## 2015-07-05 MED ORDER — FENTANYL CITRATE (PF) 100 MCG/2ML IJ SOLN
INTRAMUSCULAR | Status: AC
  Filled 2015-07-05: qty 2

## 2015-07-05 MED ORDER — ACETAMINOPHEN 10 MG/ML IV SOLN
INTRAVENOUS | Status: DC | PRN
  Administered 2015-07-05: 1000 mg via INTRAVENOUS

## 2015-07-05 MED ORDER — CALCIUM CARBONATE-VITAMIN D 500-200 MG-UNIT PO TABS
1.0000 | ORAL_TABLET | Freq: Every day | ORAL | Status: DC
  Administered 2015-07-06 – 2015-07-07 (×2): 1 via ORAL
  Filled 2015-07-05 (×2): qty 1

## 2015-07-05 MED ORDER — CHLORHEXIDINE GLUCONATE 4 % EX LIQD: 60.0000 mL | Freq: Once | CUTANEOUS | Status: DC

## 2015-07-05 MED ORDER — METOCLOPRAMIDE HCL 10 MG PO TABS
10.0000 mg | ORAL_TABLET | Freq: Three times a day (TID) | ORAL | Status: AC
  Administered 2015-07-05 – 2015-07-07 (×8): 10 mg via ORAL
  Filled 2015-07-05 (×8): qty 1

## 2015-07-05 MED ORDER — OXYCODONE HCL 5 MG PO TABS
5.0000 mg | ORAL_TABLET | ORAL | Status: DC | PRN
  Administered 2015-07-05: 10 mg via ORAL
  Administered 2015-07-05: 5 mg via ORAL
  Administered 2015-07-06 (×4): 10 mg via ORAL
  Filled 2015-07-05 (×4): qty 2
  Filled 2015-07-05: qty 1
  Filled 2015-07-05: qty 2

## 2015-07-05 MED ORDER — VITAMIN E 180 MG (400 UNIT) PO CAPS
400.0000 [IU] | ORAL_CAPSULE | Freq: Every day | ORAL | Status: DC
  Administered 2015-07-05 – 2015-07-07 (×3): 400 [IU] via ORAL
  Filled 2015-07-05 (×3): qty 1

## 2015-07-05 MED ORDER — TETRACAINE HCL 1 % IJ SOLN
INTRAMUSCULAR | Status: DC | PRN
  Administered 2015-07-05 (×2): 4 mg via INTRASPINAL

## 2015-07-05 MED ORDER — PHENOL 1.4 % MT LIQD: 1.0000 | OROMUCOSAL | Status: DC | PRN

## 2015-07-05 MED ORDER — FERROUS SULFATE 325 (65 FE) MG PO TABS
325.0000 mg | ORAL_TABLET | Freq: Two times a day (BID) | ORAL | Status: DC
  Administered 2015-07-05 – 2015-07-07 (×4): 325 mg via ORAL
  Filled 2015-07-05 (×4): qty 1

## 2015-07-05 MED ORDER — FAMOTIDINE 20 MG PO TABS
ORAL_TABLET | ORAL | Status: AC
  Filled 2015-07-05: qty 1

## 2015-07-05 MED ORDER — DEXAMETHASONE SODIUM PHOSPHATE 4 MG/ML IJ SOLN
INTRAMUSCULAR | Status: DC | PRN
  Administered 2015-07-05: 10 mg via INTRAVENOUS

## 2015-07-05 MED ORDER — LOSARTAN POTASSIUM-HCTZ 100-25 MG PO TABS: 1.0000 | ORAL_TABLET | Freq: Every day | ORAL | Status: DC

## 2015-07-05 MED ORDER — LEVOTHYROXINE SODIUM 75 MCG PO TABS
75.0000 ug | ORAL_TABLET | Freq: Every day | ORAL | Status: DC
  Administered 2015-07-06 – 2015-07-07 (×2): 75 ug via ORAL
  Filled 2015-07-05 (×2): qty 1

## 2015-07-05 MED ORDER — FAMOTIDINE 20 MG PO TABS
20.0000 mg | ORAL_TABLET | Freq: Once | ORAL | Status: AC
  Administered 2015-07-05: 20 mg via ORAL

## 2015-07-05 MED ORDER — ALUM & MAG HYDROXIDE-SIMETH 200-200-20 MG/5ML PO SUSP: 30.0000 mL | ORAL | Status: DC | PRN

## 2015-07-05 MED ORDER — MAGNESIUM HYDROXIDE 400 MG/5ML PO SUSP: 30.0000 mL | Freq: Every day | ORAL | Status: DC | PRN

## 2015-07-05 MED ORDER — EPHEDRINE SULFATE 50 MG/ML IJ SOLN
INTRAMUSCULAR | Status: DC | PRN
  Administered 2015-07-05 (×5): 5 mg via INTRAVENOUS

## 2015-07-05 MED ORDER — MIDAZOLAM HCL 5 MG/5ML IJ SOLN
INTRAMUSCULAR | Status: DC | PRN
  Administered 2015-07-05: 0.5 mg via INTRAVENOUS
  Administered 2015-07-05: 1 mg via INTRAVENOUS
  Administered 2015-07-05: 0.5 mg via INTRAVENOUS

## 2015-07-05 MED ORDER — CEFAZOLIN SODIUM-DEXTROSE 2-3 GM-% IV SOLR: 2.0000 g | INTRAVENOUS | Status: DC

## 2015-07-05 MED ORDER — LACTATED RINGERS IV SOLN
INTRAVENOUS | Status: DC
  Administered 2015-07-05: 08:00:00 via INTRAVENOUS
  Administered 2015-07-05: 75 mL/h via INTRAVENOUS

## 2015-07-05 MED ORDER — MENTHOL 3 MG MT LOZG: 1.0000 | LOZENGE | OROMUCOSAL | Status: DC | PRN

## 2015-07-05 MED ORDER — BISACODYL 10 MG RE SUPP: 10.0000 mg | Freq: Every day | RECTAL | Status: DC | PRN

## 2015-07-05 MED ORDER — BUPIVACAINE IN DEXTROSE 0.75-8.25 % IT SOLN
INTRATHECAL | Status: DC | PRN
  Administered 2015-07-05: 12 mL via INTRATHECAL

## 2015-07-05 MED ORDER — POLYVINYL ALCOHOL 1.4 % OP SOLN: 1.0000 [drp] | Freq: Every day | OPHTHALMIC | Status: DC | PRN

## 2015-07-05 MED ORDER — SODIUM CHLORIDE 0.9 % IV SOLN
INTRAVENOUS | Status: DC
  Administered 2015-07-05: 13:00:00 via INTRAVENOUS

## 2015-07-05 MED ORDER — ONDANSETRON HCL 4 MG PO TABS: 4.0000 mg | ORAL_TABLET | Freq: Four times a day (QID) | ORAL | Status: DC | PRN

## 2015-07-05 MED ORDER — ENOXAPARIN SODIUM 30 MG/0.3ML ~~LOC~~ SOLN
30.0000 mg | Freq: Two times a day (BID) | SUBCUTANEOUS | Status: DC
  Administered 2015-07-06 – 2015-07-07 (×2): 30 mg via SUBCUTANEOUS
  Filled 2015-07-05 (×3): qty 0.3

## 2015-07-05 MED ORDER — ACETAMINOPHEN 10 MG/ML IV SOLN
1000.0000 mg | Freq: Four times a day (QID) | INTRAVENOUS | Status: AC
  Administered 2015-07-05 – 2015-07-06 (×4): 1000 mg via INTRAVENOUS
  Filled 2015-07-05 (×5): qty 100

## 2015-07-05 MED ORDER — ONDANSETRON HCL 4 MG/2ML IJ SOLN: 4.0000 mg | Freq: Once | INTRAMUSCULAR | Status: DC | PRN

## 2015-07-05 MED ORDER — PROPYLENE GLYCOL 0.6 % OP SOLN: 1.0000 [drp] | Freq: Every day | OPHTHALMIC | Status: DC | PRN

## 2015-07-05 MED ORDER — ACETAMINOPHEN 325 MG PO TABS
650.0000 mg | ORAL_TABLET | Freq: Four times a day (QID) | ORAL | Status: DC | PRN
Start: 2015-07-05 — End: 2015-07-07

## 2015-07-05 MED ORDER — SODIUM CHLORIDE 0.9 % IV SOLN
10000.0000 ug | INTRAVENOUS | Status: DC | PRN
  Administered 2015-07-05: 50 ug/min via INTRAVENOUS

## 2015-07-05 MED ORDER — ACETAMINOPHEN 10 MG/ML IV SOLN
INTRAVENOUS | Status: AC
  Filled 2015-07-05: qty 100

## 2015-07-05 MED ORDER — FENTANYL CITRATE (PF) 100 MCG/2ML IJ SOLN
25.0000 ug | INTRAMUSCULAR | Status: DC | PRN
  Administered 2015-07-05 (×2): 25 ug via INTRAVENOUS

## 2015-07-05 MED ORDER — ADULT MULTIVITAMIN W/MINERALS CH
1.0000 | ORAL_TABLET | Freq: Every day | ORAL | Status: DC
  Administered 2015-07-05 – 2015-07-07 (×3): 1 via ORAL
  Filled 2015-07-05 (×3): qty 1

## 2015-07-05 MED ORDER — FENTANYL CITRATE (PF) 100 MCG/2ML IJ SOLN
INTRAMUSCULAR | Status: DC | PRN
  Administered 2015-07-05: 25 ug via INTRAVENOUS
  Administered 2015-07-05: 50 ug via INTRAVENOUS
  Administered 2015-07-05: 25 ug via INTRAVENOUS

## 2015-07-05 MED ORDER — ACETAMINOPHEN 650 MG RE SUPP: 650.0000 mg | Freq: Four times a day (QID) | RECTAL | Status: DC | PRN

## 2015-07-05 MED ORDER — LORATADINE 10 MG PO TABS
10.0000 mg | ORAL_TABLET | Freq: Every day | ORAL | Status: DC
  Administered 2015-07-05 – 2015-07-07 (×3): 10 mg via ORAL
  Filled 2015-07-05 (×3): qty 1

## 2015-07-05 MED ORDER — PROPOFOL 500 MG/50ML IV EMUL
INTRAVENOUS | Status: DC | PRN
  Administered 2015-07-05: 70 ug/kg/min via INTRAVENOUS

## 2015-07-05 MED ORDER — ATORVASTATIN CALCIUM 20 MG PO TABS
40.0000 mg | ORAL_TABLET | Freq: Every day | ORAL | Status: DC
Start: 2015-07-05 — End: 2015-07-07
  Administered 2015-07-05 – 2015-07-07 (×3): 40 mg via ORAL
  Filled 2015-07-05 (×3): qty 2

## 2015-07-05 MED ORDER — SENNOSIDES-DOCUSATE SODIUM 8.6-50 MG PO TABS
1.0000 | ORAL_TABLET | Freq: Two times a day (BID) | ORAL | Status: DC
  Administered 2015-07-05 – 2015-07-06 (×4): 1 via ORAL
  Filled 2015-07-05 (×4): qty 1

## 2015-07-05 MED ORDER — CEFAZOLIN SODIUM-DEXTROSE 2-3 GM-% IV SOLR
INTRAVENOUS | Status: AC
  Administered 2015-07-05: 2 g via INTRAVENOUS
  Filled 2015-07-05: qty 50

## 2015-07-05 MED ORDER — ONDANSETRON HCL 4 MG/2ML IJ SOLN: 4.0000 mg | Freq: Four times a day (QID) | INTRAMUSCULAR | Status: DC | PRN

## 2015-07-05 SURGICAL SUPPLY — 52 items
BLADE DRUM FLTD (BLADE) ×3 IMPLANT
BLADE SAW 1 (BLADE) ×3 IMPLANT
CANISTER SUCT 1200ML W/VALVE (MISCELLANEOUS) ×3 IMPLANT
CANISTER SUCT 3000ML (MISCELLANEOUS) ×6 IMPLANT
CAPT HIP TOTAL 2 ×3 IMPLANT
CARTRIDGE OIL MAESTRO DRILL (MISCELLANEOUS) ×1 IMPLANT
CATH FOL LEG HOLDER (MISCELLANEOUS) ×3 IMPLANT
CATH TRAY METER 16FR LF (MISCELLANEOUS) ×3 IMPLANT
DIFFUSER MAESTRO (MISCELLANEOUS) ×3 IMPLANT
DRAPE INCISE IOBAN 66X60 STRL (DRAPES) ×3 IMPLANT
DRAPE SHEET LG 3/4 BI-LAMINATE (DRAPES) ×3 IMPLANT
DRAPE TABLE BACK 80X90 (DRAPES) ×3 IMPLANT
DRSG DERMACEA 8X12 NADH (GAUZE/BANDAGES/DRESSINGS) ×3 IMPLANT
DRSG OPSITE POSTOP 3X4 (GAUZE/BANDAGES/DRESSINGS) ×3 IMPLANT
DRSG OPSITE POSTOP 4X12 (GAUZE/BANDAGES/DRESSINGS) ×3 IMPLANT
DRSG OPSITE POSTOP 4X14 (GAUZE/BANDAGES/DRESSINGS) ×3 IMPLANT
DRSG TEGADERM 4X4.75 (GAUZE/BANDAGES/DRESSINGS) ×3 IMPLANT
DURAPREP 26ML APPLICATOR (WOUND CARE) ×3 IMPLANT
ELECT BLADE 6.5 EXT (BLADE) ×3 IMPLANT
ELECT CAUTERY BLADE 6.4 (BLADE) ×3 IMPLANT
GLOVE BIOGEL M STRL SZ7.5 (GLOVE) ×3 IMPLANT
GLOVE INDICATOR 8.0 STRL GRN (GLOVE) ×3 IMPLANT
GLOVE SURG 9.0 ORTHO LTXF (GLOVE) ×3 IMPLANT
GLOVE SURG ORTHO 9.0 STRL STRW (GLOVE) ×3 IMPLANT
GOWN STRL REUS W/ TWL LRG LVL3 (GOWN DISPOSABLE) ×1 IMPLANT
GOWN STRL REUS W/ TWL LRG LVL4 (GOWN DISPOSABLE) ×1 IMPLANT
GOWN STRL REUS W/TWL 2XL LVL3 (GOWN DISPOSABLE) ×3 IMPLANT
GOWN STRL REUS W/TWL LRG LVL3 (GOWN DISPOSABLE) ×2
GOWN STRL REUS W/TWL LRG LVL4 (GOWN DISPOSABLE) ×2
HANDPIECE SUCTION TUBG SURGILV (MISCELLANEOUS) ×3 IMPLANT
HEMOVAC 400CC 10FR (MISCELLANEOUS) ×3 IMPLANT
HOOD PEEL AWAY FLYTE STAYCOOL (MISCELLANEOUS) ×6 IMPLANT
KIT RM TURNOVER STRD PROC AR (KITS) ×3 IMPLANT
NDL SAFETY 18GX1.5 (NEEDLE) ×3 IMPLANT
NS IRRIG 1000ML POUR BTL (IV SOLUTION) ×3 IMPLANT
OIL CARTRIDGE MAESTRO DRILL (MISCELLANEOUS) ×3
PACK HIP PROSTHESIS (MISCELLANEOUS) ×3 IMPLANT
SOL .9 NS 3000ML IRR  AL (IV SOLUTION) ×2
SOL .9 NS 3000ML IRR UROMATIC (IV SOLUTION) ×1 IMPLANT
SOL PREP PVP 2OZ (MISCELLANEOUS) ×3
SOLUTION PREP PVP 2OZ (MISCELLANEOUS) ×1 IMPLANT
SPONGE DRAIN TRACH 4X4 STRL 2S (GAUZE/BANDAGES/DRESSINGS) ×3 IMPLANT
STAPLER SKIN PROX 35W (STAPLE) ×3 IMPLANT
SUT ETHIBOND #5 BRAIDED 30INL (SUTURE) ×3 IMPLANT
SUT VIC AB 0 CT1 36 (SUTURE) ×3 IMPLANT
SUT VIC AB 1 CT1 36 (SUTURE) ×6 IMPLANT
SUT VIC AB 2-0 CT1 27 (SUTURE) ×2
SUT VIC AB 2-0 CT1 TAPERPNT 27 (SUTURE) ×1 IMPLANT
SYR 20CC LL (SYRINGE) ×3 IMPLANT
TAPE ADH 3 LX (MISCELLANEOUS) ×3 IMPLANT
TAPE TRANSPORE STRL 2 31045 (GAUZE/BANDAGES/DRESSINGS) ×3 IMPLANT
WATER STERILE IRR 1000ML POUR (IV SOLUTION) ×6 IMPLANT

## 2015-07-05 NOTE — Progress Notes (Signed)
Physical Therapy Evaluation Patient Details Name: Latoya Douglas MRN: [id number] DOB: 06/14/1941 Today's Date: 07/05/2015   History of Present Illness  Pt is a 74 yo female who was admitted to the hospital s/p R THA (posterior) performed on 07/05/15  Clinical Impression  Pt presents with hx of HTN, DVT, recurrent UTI, arthritis, and cancer. Examination reveals that pt performs bed mobility at min assist, transfers at Mod I, and ambulation of 15 ft at [facility]. Given that it is POD 0, pt is performing AROM and mobility very well, and with well-managed pain. Her gait performance, strength, and mobility are removed from baseline, so she will still benefit from skilled PT in order for her to return home safely. She is very pleasant to work with and willing to participate in therapy tasks.   Follow Up Recommendations Home health PT;Supervision - Intermittent    Equipment Recommendations  None recommended by PT    Recommendations for Other Services       Precautions / Restrictions Precautions Precautions: Posterior Hip Precaution Booklet Issued: No Restrictions Weight Bearing Restrictions: Yes Other Position/Activity Restrictions: WBAT      Mobility  Bed Mobility Overal bed mobility: Needs Assistance Bed Mobility: Supine to Sit     Supine to sit: Min assist     General bed mobility comments: Pt performs bed mobility requiring assist for management of LEs, but is able to use siderails to assist her in getting into sitting  Transfers Overall transfer level: Modified independent Equipment used: Rolling walker (2 wheeled) Transfers: Sit to/from Stand           General transfer comment: Pt performs transfers with great use of hands prior to transfer. She rises and lowers herself with good control and is steady throughout  Ambulation/Gait Ambulation/Gait assistance: Min guard Ambulation Distance (Feet): 15 Feet Assistive device: Rolling walker (2 wheeled) Gait Pattern/deviations:  Step-through pattern;Decreased step length - right;Decreased step length - left;Decreased stride length Gait velocity: decreased Gait velocity interpretation: Below normal speed for age/gender General Gait Details: Pt ambulates with good control and no LOB noted. She needs cues on sequencing of RW with gait.   Stairs            Wheelchair Mobility    Modified Rankin (Stroke Patients Only)       Balance Overall balance assessment: No apparent balance deficits (not formally assessed)                                           Pertinent Vitals/Pain Pain Assessment: 0-10 Pain Score: 2  Pain Location: R hip Pain Descriptors / Indicators: Aching Pain Intervention(s): Limited activity within patient's tolerance;Monitored during session;Premedicated before session;Ice applied    Home Living Family/patient expects to be discharged to:: Private residence Living Arrangements: Spouse/significant other Available Help at Discharge: Family;Available 24 hours/day (husband ) Type of Home: Mobile home (double wide ) Home Access: Stairs to enter Entrance Stairs-Rails: Can reach both Entrance Stairs-Number of Steps: 4 Home Layout: One level Home Equipment: Walker - 2 wheels;Cane - single point      Prior Function Level of Independence: Independent         Comments: Pt was independent with ADLs, community ambulation, and driving     Hand Dominance        Extremity/Trunk Assessment   Upper Extremity Assessment: Overall WFL for tasks assessed  Lower Extremity Assessment: LLE deficits/detail   LLE Deficits / Details: Gross MMT of 3/5 in LLE.  (Sound AROM present on POD 0)     Communication   Communication: No difficulties  Cognition Arousal/Alertness: Awake/alert Behavior During Therapy: WFL for tasks assessed/performed Overall Cognitive Status: Within Functional Limits for tasks assessed                      General Comments       Exercises Other Exercises Other Exercises: Pt performed bilateral therex x 10 reps at supervision for proper technique. Exercises performed: ankle pumps, quad sets, glute sets, SAQ, SLR and hip abd. Pt demonstrates strong ability to perform AROM given her POD.       Assessment/Plan    PT Assessment Patient needs continued PT services  PT Diagnosis Difficulty walking;Abnormality of gait;Acute pain   PT Problem List Decreased strength;Decreased range of motion;Decreased activity tolerance;Decreased mobility;Pain  PT Treatment Interventions DME instruction;Gait training;Stair training;Functional mobility training;Therapeutic activities;Therapeutic exercise;Balance training;Neuromuscular re-education   PT Goals (Current goals can be found in the Care Plan section) Acute Rehab PT Goals Patient Stated Goal: to return home PT Goal Formulation: With patient Time For Goal Achievement: 07/19/15 Potential to Achieve Goals: Good    Frequency BID   Barriers to discharge        Co-evaluation               End of Session Equipment Utilized During Treatment: Gait belt Activity Tolerance: Patient tolerated treatment well Patient left: in chair;with call bell/phone within reach;with chair alarm set;with SCD's reapplied Nurse Communication: Mobility status         Time: 1435-1500 PT Time Calculation (min) (ACUTE ONLY): 25 min   Charges:         PT G CodesJanyth Contes 06-Jul-2015, 3:24 PM Janyth Contes, SPT. [phone]

## 2015-07-05 NOTE — Anesthesia Preprocedure Evaluation (Addendum)
Anesthesia Evaluation  Patient identified by MRN, date of birth, ID band Patient awake    Reviewed: Allergy & Precautions, NPO status , Patient's Chart, lab work & pertinent test results, reviewed documented beta blocker date and time   History of Anesthesia Complications (+) PONV  Airway Mallampati: II  TM Distance: >3 FB     Dental  (+) Chipped   Pulmonary           Cardiovascular hypertension, Pt. on medications      Neuro/Psych  Neuromuscular disease    GI/Hepatic   Endo/Other  Hypothyroidism   Renal/GU      Musculoskeletal  (+) Arthritis ,   Abdominal   Peds  Hematology   Anesthesia Other Findings carddiac studies ok and EKG reviewed.  Reproductive/Obstetrics                            Anesthesia Physical Anesthesia Plan  ASA: III  Anesthesia Plan: Spinal   Post-op Pain Management:    Induction:   Airway Management Planned: Nasal Cannula  Additional Equipment:   Intra-op Plan:   Post-operative Plan:   Informed Consent: I have reviewed the patients History and Physical, chart, labs and discussed the procedure including the risks, benefits and alternatives for the proposed anesthesia with the patient or authorized representative who has indicated his/her understanding and acceptance.     Plan Discussed with: CRNA  Anesthesia Plan Comments:         Anesthesia Quick Evaluation

## 2015-07-05 NOTE — Progress Notes (Signed)
Pain managed well with oral and IV pain med.  Pt sitting up in the chair.

## 2015-07-05 NOTE — Transfer of Care (Signed)
Immediate Anesthesia Transfer of Care Note  Patient: Latoya Douglas  Procedure(s) Performed: Procedure(s): TOTAL HIP ARTHROPLASTY (Right)  Patient Location: PACU  Anesthesia Type:Spinal  Level of Consciousness: awake, alert , oriented and patient cooperative  Airway & Oxygen Therapy: Patient Spontanous Breathing  Post-op Assessment: Report given to RN and Post -op Vital signs reviewed and stable  Post vital signs: Reviewed and stable  Last Vitals:  Filed Vitals:   07/05/15 1009  BP: 102/42  Pulse: 75  Temp:   Resp: 13    Complications: No apparent anesthesia complications

## 2015-07-05 NOTE — H&P (Signed)
The patient has been re-examined, and the chart reviewed, and there have been no interval changes to the documented history and physical.    The risks, benefits, and alternatives have been discussed at length. The patient expressed understanding of the risks benefits and agreed with plans for surgical intervention.  Bri Wakeman P. Manaal Mandala, Jr. M.D.    

## 2015-07-05 NOTE — Anesthesia Procedure Notes (Signed)
Spinal Patient location during procedure: OR Staffing Anesthesiologist: Gunnar Bulla Performed by: anesthesiologist  Preanesthetic Checklist Completed: patient identified, site marked, surgical consent, pre-op evaluation, timeout performed, IV checked and risks and benefits discussed Spinal Block Patient position: sitting Prep: Betadine Patient monitoring: heart rate, cardiac monitor, continuous pulse ox and blood pressure Approach: midline Location: L3-4 Injection technique: single-shot Needle Needle type: Pencil-Tip  Needle gauge: 25 G Needle length: 9 cm Assessment Sensory level: T10 Additional Notes Marcaine 12 mg and tetracaine 4 mg.

## 2015-07-05 NOTE — Brief Op Note (Signed)
07/05/2015  10:06 AM  PATIENT:  Latoya Douglas  74 y.o. female  PRE-OPERATIVE DIAGNOSIS:  DEGENERATIVE OSTEOARTHRITIS right hip  POST-OPERATIVE DIAGNOSIS:  same  PROCEDURE:  Procedure(s): TOTAL HIP ARTHROPLASTY (Right)  SURGEON:  Surgeon(s) and Role:    * Dereck Leep, MD - Primary  ASSISTANTS: Vance Peper, PA    ANESTHESIA:   spinal  EBL:  Total I/O In: 1600 [I.V.:1600] Out: 550 [Urine:400; Blood:150]  BLOOD ADMINISTERED:none  DRAINS: 2 medium hemovac drains   LOCAL MEDICATIONS USED:  NONE  SPECIMEN:  Source of Specimen:  right femoral head  DISPOSITION OF SPECIMEN:  PATHOLOGY  COUNTS:  YES  TOURNIQUET:  * No tourniquets in log *  DICTATION: .Dragon Dictation  PLAN OF CARE: Admit to inpatient   PATIENT DISPOSITION:  PACU - hemodynamically stable.   Delay start of Pharmacological VTE agent (>24hrs) due to surgical blood loss or risk of bleeding: yes

## 2015-07-05 NOTE — Op Note (Signed)
OPERATIVE NOTE  DATE OF SURGERY:  07/05/2015  PATIENT NAME:  Latoya Douglas   DOB: 05/07/1941  MRN: [id number]  PRE-OPERATIVE DIAGNOSIS: Degenerative arthrosis of the right hip, primary  POST-OPERATIVE DIAGNOSIS:  Same  PROCEDURE:  Right total hip arthroplasty  SURGEON:  Marciano Sequin. M.D.  ASSISTANT:  Vance Peper, PA (present and scrubbed throughout the case, critical for assistance with exposure, retraction, instrumentation, and closure)  ANESTHESIA: spinal  ESTIMATED BLOOD LOSS: 150 mL  FLUIDS REPLACED: 1600 mL of crystalloid  DRAINS: 2 medium drains to a Hemovac reservoir  IMPLANTS UTILIZED: DePuy 13.5 mm small stature AML femoral stem, 50 mm OD Pinnacle 100 acetabular component, +4 mm 10 Pinnacle Marathon polyethylene insert, and a 32 mm CoCr +1 mm hip ball  INDICATIONS FOR SURGERY: Latoya Douglas is a 74 y.o. year old female with a long history of progressive hip and groin  pain. X-rays demonstrated severe degenerative changes. The patient had not seen any significant improvement despite conservative nonsurgical intervention. After discussion of the risks and benefits of surgical intervention, the patient expressed understanding of the risks benefits and agree with plans for total hip arthroplasty.   The risks, benefits, and alternatives were discussed at length including but not limited to the risks of infection, bleeding, nerve injury, stiffness, blood clots, the need for revision surgery, limb length inequality, dislocation, cardiopulmonary complications, among others, and they were willing to proceed.  PROCEDURE IN DETAIL: The patient was brought into the operating room and, after adequate spinal anesthesia was achieved, the patient was placed in a left lateral decubitus position. Axillary roll was placed and all bony prominences were well-padded. The patient's right hip was cleaned and prepped with alcohol and DuraPrep and draped in the usual sterile fashion. A  "timeout" was performed as per usual protocol. A lateral curvilinear incision was made gently curving towards the posterior superior iliac spine. The IT band was incised in line with the skin incision and the fibers of the gluteus maximus were split in line. The piriformis tendon was identified, skeletonized, and incised at its insertion to the proximal femur and reflected posteriorly. A T type posterior capsulotomy was performed. Prior to dislocation of the femoral head, a threaded Steinmann pin was inserted through a separate stab incision into the pelvis superior to the acetabulum and bent in the form of a stylus so as to assess limb length and hip offset throughout the procedure. The femoral head was then dislocated posteriorly. Inspection of the femoral head demonstrated severe degenerative changes with full-thickness loss of articular cartilage. The femoral neck cut was performed using an oscillating saw. The anterior capsule was elevated off of the femoral neck using a periosteal elevator. Attention was then directed to the acetabulum. The remnant of the labrum was excised using electrocautery. Inspection of the acetabulum also demonstrated significant degenerative changes. The acetabulum was reamed in sequential fashion up to a 49 mm diameter. Good punctate bleeding bone was encountered. A 50 mm Pinnacle 100 acetabular component was positioned and impacted into place. Good scratch fit was appreciated. A +4 mm polyethylene trial was inserted.  Attention was then directed to the proximal femur. A hole for reaming of the proximal femoral canal was created using a high-speed burr. The femoral canal was reamed in sequential fashion up to a 13 mm diameter. This allowed for approximately 7-1/2 cm of scratch fit. It was thus elected to ream to 13.5 to allow for a line to line fit. Serial broaches were inserted  up to a 13.5 mm small stature femoral broach. Calcar region was planed and a trial reduction was  performed using a 32 mm hip ball with a +1 mm neck length. Good equalization of limb lengths and hip offset was appreciated and good stability was noted both anteriorly and posteriorly. The +4 mm neutral polyethylene was replaced with a +4 mm 10 trial with the high side position at the 8:00 location. This allowed for excellent stability. Trial components were removed. The acetabular shell was irrigated with copious amounts of normal saline with antibiotic solution and suctioned dry. A +4 mm 10 Pinnacle Marathon polyethylene insert was positioned with the high side at the 8:00 position and impacted into place. Next, a 13.5 mm small stature AML femoral stem was positioned and impacted into place. Excellent scratch fit was appreciated. A trial reduction was again performed with a 32 mm hip ball with a +1 mm neck length. Again, good equalization of limb lengths was appreciated and excellent stability appreciated both anteriorly and posteriorly. The hip was then dislocated and the trial hip ball was removed. The Morse taper was cleaned and dried. A 32 mm cobalt chrome hip ball with a +1 mm neck length was placed on the trunnion and impacted into place. The hip was then reduced and placed through range of motion. Excellent stability was appreciated both anteriorly and posteriorly.  The wound was irrigated with copious amounts of normal saline with antibiotic solution and suctioned dry. Good hemostasis was appreciated. The posterior capsulotomy was repaired using #5 Ethibond. Piriformis tendon was reapproximated to the undersurface of the gluteus medius tendon using #5 Ethibond. Two medium drains were placed in the wound bed and brought out through separate stab incisions to be attached to a Hemovac reservoir. The IT band was reapproximated using interrupted sutures of #1 Vicryl. Subcutaneous tissue was proximal phalanx using first #0 Vicryl followed by #2-0 Vicryl. The skin was closed with skin staples.  The patient  tolerated the procedure well and was transported to the recovery room in stable condition.   Marciano Sequin., M.D.

## 2015-07-06 ENCOUNTER — Other Ambulatory Visit: Payer: Self-pay | Admitting: Orthopedic Surgery

## 2015-07-06 LAB — BASIC METABOLIC PANEL
ANION GAP: 6 (ref 5–15)
BUN: 17 mg/dL (ref 6–20)
CALCIUM: 8.8 mg/dL — AB (ref 8.9–10.3)
CO2: 25 mmol/L (ref 22–32)
Chloride: 106 mmol/L (ref 101–111)
Creatinine, Ser: 0.7 mg/dL (ref 0.44–1.00)
Glucose, Bld: 129 mg/dL — ABNORMAL HIGH (ref 65–99)
Potassium: 4.2 mmol/L (ref 3.5–5.1)
SODIUM: 137 mmol/L (ref 135–145)

## 2015-07-06 LAB — CBC
HEMATOCRIT: 30.7 % — AB (ref 35.0–47.0)
Hemoglobin: 10.3 g/dL — ABNORMAL LOW (ref 12.0–16.0)
MCH: 31.2 pg (ref 26.0–34.0)
MCHC: 33.4 g/dL (ref 32.0–36.0)
MCV: 93.2 fL (ref 80.0–100.0)
PLATELETS: 242 10*3/uL (ref 150–440)
RBC: 3.3 MIL/uL — ABNORMAL LOW (ref 3.80–5.20)
RDW: 12.4 % (ref 11.5–14.5)
WBC: 16.5 10*3/uL — AB (ref 3.6–11.0)

## 2015-07-06 NOTE — Progress Notes (Signed)
Physical Therapy Treatment Patient Details Name: Latoya Douglas MRN: [id number] DOB: [birth year] Today's Date: 07/06/2015    History of Present Illness Pt is a 74 yo female who was admitted to the hospital s/p R THA (posterior) performed on 07/05/15    PT Comments    Pt continues to demonstrate continued progress with ambulation distance and quality. Initiated standing exercises; performs well. Min A required for back to bed and use of trapeze. Educated pt and spouse on avoiding "overdoing" at home; they understand. Continue PT for strengthening and functional mobility for optimal discharge home with home health PT.   Follow Up Recommendations  Home health PT;Supervision - Intermittent     Equipment Recommendations  None recommended by PT    Recommendations for Other Services       Precautions / Restrictions Precautions Precautions: Posterior Hip Restrictions Weight Bearing Restrictions: Yes Other Position/Activity Restrictions: WBAT    Mobility  Bed Mobility Overal bed mobility: Needs Assistance Bed Mobility: Sit to Supine       Sit to supine: Min assist   General bed mobility comments: Min A for LEs and reposition in bed; use of trapeze to pull upright in bed  Transfers Overall transfer level: Modified independent Equipment used: Rolling walker (2 wheeled) Transfers: Sit to/from Stand Sit to Stand: Modified independent (Device/Increase time)         General transfer comment: demonstrates good awareness of posterior hip precautions and hand placement with transfer  Ambulation/Gait Ambulation/Gait assistance: Min guard Ambulation Distance (Feet): 260 Feet Assistive device: Rolling walker (2 wheeled) Gait Pattern/deviations: Step-through pattern Gait velocity: mildly decreased Gait velocity interpretation: Below normal speed for age/gender General Gait Details: Demonstrates good reciprocal pattern; continues with cues to watch for R toe in   Stairs Stairs:  Yes Stairs assistance: Min guard Stair Management: One rail Right (BUEs on 1 rail) Number of Stairs: 6 General stair comments: Performs well with only verbal cues initially for sequence  Wheelchair Mobility    Modified Rankin (Stroke Patients Only)       Balance Overall balance assessment: No apparent balance deficits (not formally assessed)                                  Cognition Arousal/Alertness: Awake/alert Behavior During Therapy: WFL for tasks assessed/performed Overall Cognitive Status: Within Functional Limits for tasks assessed       Memory: Decreased recall of precautions (cues for 2 of 3 this pm; compliant with 2 well with function)              Exercises Total Joint Exercises Hip ABduction/ADduction: Strengthening;Right;20 reps;Standing (2 set of 10) Straight Leg Raises: Strengthening;Right;20 reps;Standing (2 sets of 10) Marching in Standing: Strengthening;Right;20 reps;Standing (2 sets of 10) Standing Hip Extension: Strengthening;20 reps;Right;Standing (2 sets of 10) Other Exercises Other Exercises: Seated rest breaks between each exercise    General Comments        Pertinent Vitals/Pain Pain Assessment: 0-10 Pain Score: 5  Pain Location: R buttock; thigh, sometimes to ankle Pain Descriptors / Indicators: Constant Pain Intervention(s): Monitored during session;Premedicated before session    Home Living                      Prior Function            PT Goals (current goals can now be found in the care plan section) Acute Rehab PT Goals Patient Stated  Goal: to return home Progress towards PT goals: Progressing toward goals    Frequency  BID    PT Plan Current plan remains appropriate    Co-evaluation             End of Session Equipment Utilized During Treatment: Gait belt Activity Tolerance: Patient tolerated treatment well Patient left: in bed;with call bell/phone within reach;with bed alarm set;with  family/visitor present     Time: 1331-1407 PT Time Calculation (min) (ACUTE ONLY): 36 min  Charges:  $Gait Training: 8-22 mins $Therapeutic Exercise: 8-22 mins                    G Codes:      Latoya Douglas 07/06/2015, 2:16 PM

## 2015-07-06 NOTE — Progress Notes (Signed)
ORTHOPAEDICS PROGRESS NOTE  PATIENT NAME: Latoya Douglas DOB: 28-Jan-1941  MRN: [id number]  POD # 1: Right total hip arthroplasty  Subjective: Patient reports pain as well controlled.   She denies any nausea.   Objective: Vital signs in last 24 hours: Temp:  [97.5 F (36.4 C)-100 F (37.8 C)] 98.1 F (36.7 C) (11/08 0340) Pulse Rate:  [56-80] 56 (11/08 0340) Resp:  [13-18] 18 (11/08 0340) BP: (97-129)/(35-55) 112/43 mmHg (11/08 0340) SpO2:  [94 %-100 %] 94 % (11/08 0340) FiO2 (%):  [21 %] 21 % (11/07 1134)  Intake/Output from previous day: 11/07 0701 - 11/08 0700 In: 4821 [P.O.:1000; I.V.:3471; IV Piggyback:200] Out: 4390 [Urine:4050; Drains:190; Blood:150]   Recent Labs  07/06/15 0457  WBC 16.5*  HGB 10.3*  HCT 30.7*  PLT 242  K 4.2  CL 106  CO2 25  BUN 17  CREATININE 0.70  GLUCOSE 129*  CALCIUM 8.8*    EXAM General: Patient is Alert and Appropriate Lungs: clear to auscultation Cardiac: normal rate, regular rhythm, normal S1, S2, no murmurs, rubs, clicks or gallops Right lower extremity: No ecchymosis or erythema. Thigh is supple. Homans test is negative.  Dressing: dressing C/D/I. Hemovac drain is in place.  Neurologic: intact, moving foot and toes well on exam.   Assessment: Right total hip arthroplasty  Active Problems:   S/P total hip arthroplasty   Plan: Up with therapy. Today's goal were reviewed with the patient.  Plan is to go Home after hospital stay. DVT Prophylaxis - Lovenox, Foot Pumps and TED hose Weight-Bearing as tolerated to right leg. Labs in the morning.   Hagan Maltz P. Holley Bouche M.D.

## 2015-07-06 NOTE — Progress Notes (Signed)
Patient cooperative with care and pain is being managed with PO pain medications this shift.  Patient plan to discharge to home with home health, already had equipment at home.  Patient walked around nurses station and completed stairs this shift without issue.

## 2015-07-06 NOTE — Progress Notes (Signed)
Physical Therapy Treatment Patient Details Name: Latoya Douglas MRN: [id number] DOB: 1941-03-04 Today's Date: 07/06/2015    History of Present Illness Pt is a 74 yo female who was admitted to the hospital s/p R THA (posterior) performed on 07/05/15    PT Comments    Pt demonstrating excellent progress with improved distance and quality of ambulation. Rolling walker adjusted for appropriate fit with improved comfort with ambulation.  Initiated steps requiring only initial cues for sequence and Min guard assist. Pt's pain well controlled and tolerating up in chair well. Pt given pillows for greater comfort on R buttock with sit with improved tolerance. Plan to see pt this pm for continued ambulation and exercises to progress strength and all functional mobility.   Follow Up Recommendations  Home health PT;Supervision - Intermittent     Equipment Recommendations  None recommended by PT    Recommendations for Other Services       Precautions / Restrictions Precautions Precautions: Posterior Hip Restrictions Weight Bearing Restrictions: Yes Other Position/Activity Restrictions: WBAT    Mobility  Bed Mobility Overal bed mobility: Modified Independent Bed Mobility: Supine to Sit     Supine to sit: Modified independent (Device/Increase time)     General bed mobility comments: Not tested; up in chair  Transfers Overall transfer level: Modified independent Equipment used: Rolling walker (2 wheeled) Transfers: Sit to/from Stand Sit to Stand: Modified independent (Device/Increase time)         General transfer comment: demonstrates good awareness of posterior hip precautions and hand placement with transfer  Ambulation/Gait Ambulation/Gait assistance: Min guard Ambulation Distance (Feet): 120 Feet Assistive device: Rolling walker (2 wheeled) Gait Pattern/deviations: Step-through pattern;Decreased dorsiflexion - right Gait velocity: decreased Gait velocity interpretation:  Below normal speed for age/gender General Gait Details: Rw adjusted for appropriate height; initially requires cues for equal step lengths, but corrects nicely. Cues for keeping toes in direction of nose on turns to avoid toe in.    Stairs Stairs: Yes Stairs assistance: Min guard Stair Management: One rail Right (BUEs on 1 rail) Number of Stairs: 6 General stair comments: Performs well with only verbal cues initially for sequence  Wheelchair Mobility    Modified Rankin (Stroke Patients Only)       Balance Overall balance assessment: No apparent balance deficits (not formally assessed)                                  Cognition Arousal/Alertness: Awake/alert Behavior During Therapy: WFL for tasks assessed/performed Overall Cognitive Status: Within Functional Limits for tasks assessed       Memory: Decreased recall of precautions (Remembers and compliant with 2 of 3)              Exercises      General Comments        Pertinent Vitals/Pain Pain Assessment: 0-10 Pain Score: 5  Pain Location: R buttock Pain Descriptors / Indicators: Aching;Sore Pain Intervention(s): Monitored during session;Premedicated before session    [place] expects to be discharged to:: Private residence Living Arrangements: Spouse/significant other Available Help at Discharge: Family;Available 24 hours/day   Home Access: Stairs to enter Entrance Stairs-Rails: Can reach both Home Layout: One level Home Equipment: Walker - 2 wheels;Cane - single point      Prior Function Level of Independence: Independent          PT Goals (current goals can now be found in the care  plan section) Acute Rehab PT Goals Patient Stated Goal: to return home Progress towards PT goals: Progressing toward goals    Frequency  BID    PT Plan Current plan remains appropriate    Co-evaluation             End of Session Equipment Utilized During Treatment: Gait  belt Activity Tolerance: Patient tolerated treatment well (mild increase in R buttock pain with ambulation; subsides ) Patient left: in chair;with call bell/phone within reach;with chair alarm set     Time: 0867-6195 PT Time Calculation (min) (ACUTE ONLY): 24 min  Charges:  $Gait Training: 23-37 mins                    G Codes:      Charlaine Dalton 07/06/2015, 11:39 AM

## 2015-07-06 NOTE — Anesthesia Postprocedure Evaluation (Signed)
  Anesthesia Post-op Note  Patient: Latoya Douglas  Procedure(s) Performed: Procedure(s): TOTAL HIP ARTHROPLASTY (Right)  Anesthesia type:Spinal  Patient location: PACU  Post pain: Pain level controlled  Post assessment: Post-op Vital signs reviewed, Patient's Cardiovascular Status Stable, Respiratory Function Stable, Patent Airway and No signs of Nausea or vomiting  Post vital signs: Reviewed and stable  Last Vitals:  Filed Vitals:   07/06/15 0340  BP: 112/43  Pulse: 56  Temp: 36.7 C  Resp: 18    Level of consciousness: awake, alert  and patient cooperative  Complications: No apparent anesthesia complications

## 2015-07-06 NOTE — Progress Notes (Signed)
Clinical Education officer, museum (CSW) received SNF consult. PT is recommended home health. RN Case Manager aware of above. Please reconsult if future social work needs arise. CSW signing off.   Blima Rich, [place] [phone]

## 2015-07-06 NOTE — Evaluation (Signed)
Occupational Therapy Evaluation Patient Details Name: Latoya Douglas MRN: [id number] DOB: [birth year] Today's Date: 07/06/2015    History of Present Illness Pt is a 74 yo female who was admitted to the hospital s/p R THA (posterior) performed on 07/05/15   Clinical Impression   This patient is a 74 year old femalewho came to [facility] for a R total hip replacement (posterior approach) .  Patient lives in a one story home with 4 steps to enter bilateral rails.  She had been independent with ADL and functional mobility. She  now requires  assistance and would benefit from Occupational Therapy for ADL/functional mobility training while .staying within hip precautions (posterior approach) .      Follow Up Recommendations    Home with home health Physical Therapy may not need Occupational Therapy at home, to be further evaluated.    Equipment Recommendations       Recommendations for Other Services       Precautions / Restrictions Precautions Precautions: Posterior Hip Restrictions Other Position/Activity Restrictions: WBAT      Mobility Bed Mobility Overal bed mobility: Modified Independent Bed Mobility: Supine to Sit     Supine to sit: Modified independent (Device/Increase time)     General bed mobility comments: Cues to stay with in hip precautions (posterior)  Transfers Overall transfer level:  (contact guard) Equipment used: Rolling walker (2 wheeled)             General transfer comment: sit to stand contact guard and verbal cues for technique    Balance                                            ADL                                         General ADL Comments: Patient had been independent. Patient practiced Donned/doffed socks and pants to knees (drain still in place) needed verbal cues to stay within hip precautions and for technique.      Vision     Perception     Praxis      Pertinent  Vitals/Pain Pain Score: 4  Pain Location: R hip     Hand Dominance Left   Extremity/Trunk Assessment Upper Extremity Assessment Upper Extremity Assessment: Defer to OT evaluation   Lower Extremity Assessment Lower Extremity Assessment: Defer to PT evaluation       Communication Communication Communication: No difficulties   Cognition Arousal/Alertness: Awake/alert Behavior During Therapy: WFL for tasks assessed/performed Overall Cognitive Status: Within Functional Limits for tasks assessed                     General Comments       Exercises       Shoulder Instructions      Home Living Family/patient expects to be discharged to:: Private residence Living Arrangements: Spouse/significant other Available Help at Discharge: Family;Available 24 hours/day   Home Access: Stairs to enter [employer] of Steps: 4 Entrance Stairs-Rails: Can reach both Home Layout: One level     Bathroom Shower/Tub: Walk-in shower         Home Equipment: Environmental consultant - 2 wheels;Cane - single point          Prior Functioning/Environment  Level of Independence: Independent             OT Diagnosis: Acute pain   OT Problem List: Decreased activity tolerance;Pain   OT Treatment/Interventions: Self-care/ADL training    OT Goals(Current goals can be found in the care plan section) Acute Rehab OT Goals Patient Stated Goal: to return home OT Goal Formulation: With patient Time For Goal Achievement: 07/20/15 Potential to Achieve Goals: Good  OT Frequency: Min 1X/week   Barriers to D/C:            Co-evaluation              End of Session Equipment Utilized During Treatment: Gait belt (hip kit)  Activity Tolerance:   Patient left: in chair;with call bell/phone within reach;with chair alarm set;with family/visitor present   Time: 0937-1010 OT Time Calculation (min): 33 min Charges:  OT General Charges $OT Visit: 1 Procedure OT Evaluation $Initial  OT Evaluation Tier I: 1 Procedure OT Treatments $Self Care/Home Management : 8-22 mins G-Codes:    Myrene Galas, MS/OTR/L  07/06/2015, 10:19 AM

## 2015-07-06 NOTE — Care Management Note (Signed)
Case Management Note  Patient Details  Name: MALEA SWILLING MRN: [id number] Date of Birth: 1941/04/30  Subjective/Objective:                  Met with patient and her husband Joe to discuss discharge planning. She plans to return home at discharge. She has a rolling walker available for use at home. She does not feel that she will need a shower chair; she did however ask about a "hip kit". Joe advised to pick up hip kit at her pharmacy or [facility] and he agrees. She would like to return home at discharge. She uses [facility] for Rx [phone]. She would like to use [facility] for HHPT.  Action/Plan: List of home health agencies provided to patient and husband Joe. Lovenox 51m #14 called into [facility] for cost and availability; Cost $10.     Expected Discharge Date:  07/08/15               Expected Discharge Plan:     In-House Referral:     Discharge planning Services  CM Consult  Post Acute Care Choice:  Home Health Choice offered to:  Patient, Spouse  DME Arranged:    DME Agency:     HH Arranged:  PT HH Agency:  [facility] Status of Service:  In process, will continue to follow  Medicare Important Message Given:    Date Medicare IM Given:    Medicare IM give by:    Date Additional Medicare IM Given:    Additional Medicare Important Message give by:     If discussed at [place], dates discussed:    Additional Comments:  AMarshell Garfinkel RN 07/06/2015, 2:19 PM

## 2015-07-07 LAB — CBC
HEMATOCRIT: 31.5 % — AB (ref 35.0–47.0)
HEMOGLOBIN: 10.5 g/dL — AB (ref 12.0–16.0)
MCH: 31.3 pg (ref 26.0–34.0)
MCHC: 33.4 g/dL (ref 32.0–36.0)
MCV: 93.7 fL (ref 80.0–100.0)
PLATELETS: 241 10*3/uL (ref 150–440)
RBC: 3.36 MIL/uL — AB (ref 3.80–5.20)
RDW: 12.5 % (ref 11.5–14.5)
WBC: 13.9 10*3/uL — AB (ref 3.6–11.0)

## 2015-07-07 LAB — BASIC METABOLIC PANEL
ANION GAP: 7 (ref 5–15)
BUN: 15 mg/dL (ref 6–20)
CHLORIDE: 105 mmol/L (ref 101–111)
CO2: 27 mmol/L (ref 22–32)
Calcium: 9 mg/dL (ref 8.9–10.3)
Creatinine, Ser: 0.62 mg/dL (ref 0.44–1.00)
GFR calc Af Amer: >60 mL/min (ref >60)
GLUCOSE: 96 mg/dL (ref 65–99)
POTASSIUM: 3.8 mmol/L (ref 3.5–5.1)
Sodium: 139 mmol/L (ref 135–145)

## 2015-07-07 LAB — SURGICAL PATHOLOGY

## 2015-07-07 MED ORDER — ENOXAPARIN SODIUM 40 MG/0.4ML ~~LOC~~ SOLN: 40.0000 mg | SUBCUTANEOUS | Status: DC

## 2015-07-07 MED ORDER — TRAMADOL HCL 50 MG PO TABS: 50.0000 mg | ORAL_TABLET | ORAL | Status: DC | PRN

## 2015-07-07 MED ORDER — OXYCODONE HCL 5 MG PO TABS
5.0000 mg | ORAL_TABLET | ORAL | Status: DC | PRN
Start: 2015-07-07 — End: 2015-08-28

## 2015-07-07 NOTE — Progress Notes (Signed)
Occupational Therapy Treatment Patient Details Name: Latoya Douglas MRN: [id number] DOB: Oct 18, 1940 Today's Date: 07/07/2015    History of present illness Pt is a 74 yo female who was admitted to the hospital s/p R THA (posterior) performed on 07/05/15   OT comments  Met with patient to discuss where to purchase hip kit since she was leaving to go home today.  Practiced tech to doff socks and don shoes while seated in recliner with supervision and min cues.  She continues to have pain and tenderness in R hip.  All goals met for OT and no further OT needed.  Follow Up Recommendations       Equipment Recommendations       Recommendations for Other Services      Precautions / Restrictions Precautions Precautions: Posterior Hip Restrictions Weight Bearing Restrictions: Yes Other Position/Activity Restrictions: WBAT       Mobility Bed Mobility Overal bed mobility:  (Received in recliner; not assessed)                Transfers Overall transfer level: Modified independent Equipment used: Rolling walker (2 wheeled) Transfers: Sit to/from Stand Sit to Stand: Modified independent (Device/Increase time)         General transfer comment: Steady control of rising and lowering with good use of hands. Stable throughout transfers    Balance Overall balance assessment: No apparent balance deficits (not formally assessed)                                 ADL                                         General ADL Comments: Discussed where to purchase AD for ADLs and assisted with doffiing socks and donning shoes prior to DC home.        Vision                     Perception     Praxis      Cognition   Behavior During Therapy: WFL for tasks assessed/performed Overall Cognitive Status: Within Functional Limits for tasks assessed                       Extremity/Trunk Assessment               Exercises      Shoulder Instructions       General Comments      Pertinent Vitals/ Pain       Pain Assessment: 0-10 Pain Score: 6  Pain Location: R hip Pain Descriptors / Indicators: Constant Pain Intervention(s): Monitored during session;Limited activity within patient's tolerance;Premedicated before session  Home Living                                          Prior Functioning/Environment              Frequency       Progress Toward Goals  OT Goals(current goals can now be found in the care plan section)     Acute Rehab OT Goals Patient Stated Goal: to keep doing therapy  Plan      Co-evaluation  End of Session     Activity Tolerance     Patient Left     Nurse Communication          Time: 2563-8937 OT Time Calculation (min): 20 min  Charges: OT General Charges $OT Visit: 1 Procedure OT Treatments $Self Care/Home Management : 8-22 mins  Chastin Riesgo 07/07/2015, 4:43 PM  Chrys Racer, OTR/L ascom [phone]

## 2015-07-07 NOTE — Progress Notes (Signed)
Plan for patient to go home today, pending discharge orders from Dr. Marry Guan.  Patient also waiting on spouse to arrive to pick her up when discharge is ready.  Pain is being managed with Po medication and no further complaints from patient.

## 2015-07-07 NOTE — Care Management (Signed)
Patient eager to discharge to home today when her husband returns from [facility]. No current discharge orders but Tim with [facility] is prepared to follow for HHPT. Octavia Bruckner has visited with patient today. Lovenox price $10 and patient is aware.

## 2015-07-07 NOTE — Discharge Instructions (Signed)

## 2015-07-07 NOTE — Progress Notes (Signed)
Physical Therapy Treatment Patient Details Name: Latoya Douglas MRN: [id number] DOB: May 13, 1941 Today's Date: 07/07/2015    History of Present Illness Pt is a 74 yo female who was admitted to the hospital s/p R THA (posterior) performed on 07/05/15    PT Comments    Pt progresses ambulation distance and quality this afternoon. She is remembering her precautions and improving safety practices with transfers. She still has gait and strength deficits that will continue to benefit from skilled PT in order to return to optimal PLOF. Pt continues to be pleasant and motivated to participate.   Follow Up Recommendations  Home health PT;Supervision - Intermittent     Equipment Recommendations  None recommended by PT    Recommendations for Other Services       Precautions / Restrictions Precautions Precautions: Posterior Hip Restrictions Weight Bearing Restrictions: Yes Other Position/Activity Restrictions: WBAT    Mobility  Bed Mobility Overal bed mobility:  (Received in recliner; not assessed)                Transfers Overall transfer level: Modified independent Equipment used: Rolling walker (2 wheeled) Transfers: Sit to/from Stand Sit to Stand: Modified independent (Device/Increase time)         General transfer comment: Steady control of rising and lowering with good use of hands. Stable throughout transfers  Ambulation/Gait Ambulation/Gait assistance: Supervision Ambulation Distance (Feet): 350 Feet Assistive device: Rolling walker (2 wheeled) Gait Pattern/deviations: Step-through pattern Gait velocity: mildly decreased Gait velocity interpretation: Below normal speed for age/gender General Gait Details: Pt ambulates with smoother gait pattern when cued to speed up. No more increase in pain with this. Pt stable in ambulation    Stairs            Wheelchair Mobility    Modified Rankin (Stroke Patients Only)       Balance Overall balance  assessment: No apparent balance deficits (not formally assessed)                                  Cognition Arousal/Alertness: Awake/alert Behavior During Therapy: WFL for tasks assessed/performed Overall Cognitive Status: Within Functional Limits for tasks assessed                      Exercises      General Comments        Pertinent Vitals/Pain Pain Assessment: 0-10 Pain Score: 6  Pain Location: R hip Pain Descriptors / Indicators: Constant Pain Intervention(s): Monitored during session;Limited activity within patient's tolerance;Premedicated before session    Home Living                      Prior Function            PT Goals (current goals can now be found in the care plan section) Acute Rehab PT Goals Patient Stated Goal: to keep doing therapy PT Goal Formulation: With patient Time For Goal Achievement: 07/19/15 Potential to Achieve Goals: Good Progress towards PT goals: Progressing toward goals    Frequency  BID    PT Plan Current plan remains appropriate    Co-evaluation             End of Session Equipment Utilized During Treatment: Gait belt Activity Tolerance: Patient tolerated treatment well Patient left: in chair;with call bell/phone within reach;with chair alarm set     Time: 1660-6301 PT Time Calculation (min) (ACUTE  ONLY): 11 min  Charges:                       G CodesJanyth Contes 08/04/15, 3:33 PM  Janyth Contes, SPT. [phone]

## 2015-07-07 NOTE — Care Management Important Message (Signed)
Important Message  Patient Details  Name: Latoya Douglas MRN: [id number] Date of Birth: 07-May-1941   Medicare Important Message Given:  Yes-second notification given    Marshell Garfinkel, RN 07/07/2015, 7:54 AM

## 2015-07-07 NOTE — Progress Notes (Signed)
Physical Therapy Treatment Patient Details Name: Latoya Douglas MRN: [id number] DOB: 03/09/1941 Today's Date: 07/07/2015    History of Present Illness Pt is a 74 yo female who was admitted to the hospital s/p R THA (posterior) performed on 07/05/15    PT Comments    Pt continuing to progress. She has met all PT goals and has demonstrated ability to perform mobility with relative independence and safety. She notes slightly higher pain this morning, but this does not affect her ability to participate. Pt is very pleasant and motivated. She still has strength, ROM, and gait deficits that will continue to benefit from skilled PT.   Follow Up Recommendations  Home health PT;Supervision - Intermittent     Equipment Recommendations  None recommended by PT    Recommendations for Other Services       Precautions / Restrictions Precautions Precautions: Posterior Hip Precaution Booklet Issued: Yes (comment) Restrictions Weight Bearing Restrictions: Yes Other Position/Activity Restrictions: WBAT    Mobility  Bed Mobility Overal bed mobility:  (Received in recliner; not assessed )                Transfers Overall transfer level: Modified independent Equipment used: Rolling walker (2 wheeled) Transfers: Sit to/from Stand Sit to Stand: Modified independent (Device/Increase time)         General transfer comment: Steady control of rising and lowering with good use of hands. Stable throughout transfers  Ambulation/Gait Ambulation/Gait assistance: Min guard Ambulation Distance (Feet): 300 Feet Assistive device: Rolling walker (2 wheeled) Gait Pattern/deviations: Step-through pattern;Decreased step length - right;Decreased step length - left;Antalgic (approaching WFL) Gait velocity: mildly decreased Gait velocity interpretation: Below normal speed for age/gender General Gait Details: Pt ambulating with decreased speed and slightly antalgic gait on R side.   Stairs             Wheelchair Mobility    Modified Rankin (Stroke Patients Only)       Balance Overall balance assessment: No apparent balance deficits (not formally assessed)                                  Cognition Arousal/Alertness: Awake/alert Behavior During Therapy: WFL for tasks assessed/performed Overall Cognitive Status: Within Functional Limits for tasks assessed       Memory: Decreased recall of precautions (remembers 2/3 precautions)              Exercises Other Exercises Other Exercises: Pt performed bilateral therex x 15 reps at supervision for proper technique. Exercises performed: SLR, LAQ, closed chain plantar flexion, standing hip ext and hip abd (pt stable in SLS with RW)    General Comments        Pertinent Vitals/Pain Pain Assessment: 0-10 Pain Score: 7  Pain Location: R hip Pain Descriptors / Indicators: Constant;Aching Pain Intervention(s): Limited activity within patient's tolerance;Monitored during session;Premedicated before session    Home Living                      Prior Function            PT Goals (current goals can now be found in the care plan section) Acute Rehab PT Goals Patient Stated Goal: to walk again PT Goal Formulation: With patient Time For Goal Achievement: 07/19/15 Potential to Achieve Goals: Good Progress towards PT goals: Progressing toward goals    Frequency  BID    PT Plan Current plan  remains appropriate    Co-evaluation             End of Session Equipment Utilized During Treatment: Gait belt Activity Tolerance: Patient tolerated treatment well Patient left: in chair;with call bell/phone within reach;with chair alarm set     Time: [phone] PT Time Calculation (min) (ACUTE ONLY): 24 min  Charges:                       G CodesJanyth Contes July 26, 2015, 11:34 AM  Janyth Contes, SPT. [phone]

## 2015-08-03 NOTE — Discharge Summary (Signed)
Physician Discharge Summary  Patient ID: Latoya Douglas MRN: [id number] DOB/AGE: 1941/06/08 74 y.o.  Admit date: 07/05/2015 Discharge date: 07/07/2015  Admission Diagnoses:  DEGENERATIVE OSTEOARTHRITIS   Discharge Diagnoses: Patient Active Problem List   Diagnosis Date Noted  . S/P total hip arthroplasty 07/05/2015  . Lumbar spondylosis 10/15/2014  . Lumbar canal stenosis 06/23/2014  . DDD (degenerative disc disease), lumbar 06/03/2014  . Neuritis or radiculitis due to rupture of lumbar intervertebral disc 06/03/2014    Past Medical History  Diagnosis Date  . Hypothyroidism   . Hyperlipidemia   . Hypertension   . DVT (deep venous thrombosis) ([place])     in arm (from IV)  "Years ago"  . Frequent UTI   . Arthritis   . PONV (postoperative nausea and vomiting)   . Cough     post nasal drip  . Cancer ([place])     skin cancer on forehead/face     Transfusion: No transfusions on this admission   Consultants (if any):   case management for home health assistance  Discharged Condition: Improved  Hospital Course: JAZZMYNE KELLOUGH is an 74 y.o. female who was admitted 07/05/2015 with a diagnosis of degenerative arthrosis right hip and went to the operating room on 07/05/2015 and underwent the above named procedures.    Surgeries:Procedure(s): TOTAL HIP ARTHROPLASTY on 07/05/2015  PRE-OPERATIVE DIAGNOSIS: Degenerative arthrosis of the right hip, primary  POST-OPERATIVE DIAGNOSIS: Same  PROCEDURE: Right total hip arthroplasty  SURGEON: Marciano Sequin. M.D.  ASSISTANT: Vance Peper, PA (present and scrubbed throughout the case, critical for assistance with exposure, retraction, instrumentation, and closure)  ANESTHESIA: spinal  ESTIMATED BLOOD LOSS: 150 mL  FLUIDS REPLACED: 1600 mL of crystalloid  DRAINS: 2 medium drains to a Hemovac reservoir  IMPLANTS UTILIZED: DePuy 13.5 mm small stature AML femoral stem, 50 mm OD Pinnacle 100 acetabular component, +4 mm 10  Pinnacle Marathon polyethylene insert, and a 32 mm CoCr +1 mm hip ball  INDICATIONS FOR SURGERY: Latoya Douglas is a 74 y.o. year old female with a long history of progressive hip and groin pain. X-rays demonstrated severe degenerative changes. The patient had not seen any significant improvement despite conservative nonsurgical intervention. After discussion of the risks and benefits of surgical intervention, the patient expressed understanding of the risks benefits and agree with plans for total hip arthroplasty.   The risks, benefits, and alternatives were discussed at length including but not limited to the risks of infection, bleeding, nerve injury, stiffness, blood clots, the need for revision surgery, limb length inequality, dislocation, cardiopulmonary complications, among others, and they were willing to proceed. Patient tolerated the surgery well. No complications .Patient was taken to PACU where she was stabilized and then transferred to the orthopedic floor.  Patient started on Lovenox 30 q 12 hrs. Foot pumps applied bilaterally at 80 mm hg. Heels elevated off bed with rolled towels. No evidence of DVT. Calves non tender. Negative [place]. Physical therapy started on day #1 for gait training and transfer with OT starting on  day #1 for ADL and assisted devices. Patient has done well with therapy. Ambulated 300 feet upon being discharged. Was able to go up 4 steps independently and safely.  Patient's IV  and Foley were discontinued on day #1 with the Hemovac being discontinued on day #2   She was given perioperative antibiotics:  Anti-infectives    Start     Dose/Rate Route Frequency Ordered Stop   07/05/15 1145  ceFAZolin (ANCEF) IVPB  2 g/50 mL premix     2 g 100 mL/hr over 30 Minutes Intravenous Every 6 hours 07/05/15 1133 07/06/15 0627   07/05/15 0557  ceFAZolin (ANCEF) IVPB 2 g/50 mL premix  Status:  Discontinued     2 g 100 mL/hr over 30 Minutes Intravenous On call to O.R.  07/05/15 0558 07/05/15 1121   07/05/15 0554  ceFAZolin (ANCEF) 2-3 GM-% IVPB SOLR    Comments:  Latoya Douglas: cabinet override      07/05/15 0554 07/05/15 0733    .  She was fitted with AV 1 compression foot pump devices bilaterally, early ambulation, and TED stockings and heel pumps for DVT prophylaxis.  She benefited maximally from the hospital stay and there were no complications.    Recent vital signs:  Filed Vitals:   07/07/15 1510 07/07/15 1624  BP: 137/59 142/64  Pulse: 84 82  Temp: 98.9 F (37.2 C) 97.5 F (36.4 C)  Resp: 18 18    Recent laboratory studies:  Lab Results  Component Value Date   HGB 10.5* 07/07/2015   HGB 10.3* 07/06/2015   HGB 12.5 06/23/2015   Lab Results  Component Value Date   WBC 13.9* 07/07/2015   PLT 241 07/07/2015   Lab Results  Component Value Date   INR 1.04 06/23/2015   Lab Results  Component Value Date   NA 139 07/07/2015   K 3.8 07/07/2015   CL 105 07/07/2015   CO2 27 07/07/2015   BUN 15 07/07/2015   CREATININE 0.62 07/07/2015   GLUCOSE 96 07/07/2015    Discharge Medications:     Medication List    STOP taking these medications        aspirin EC 81 MG tablet     naproxen sodium 220 MG tablet  Commonly known as:  ANAPROX      TAKE these medications        acetaminophen 650 MG CR tablet  Commonly known as:  TYLENOL  Take 650 mg by mouth daily after supper. 1 or 2     atorvastatin 40 MG tablet  Commonly known as:  LIPITOR  Take 40 mg by mouth daily.     CALCIUM 500/D 500-200 MG-UNIT tablet  Generic drug:  calcium-vitamin D  Take 1 tablet by mouth daily with breakfast.     cetirizine 10 MG tablet  Commonly known as:  ZYRTEC  Take 10 mg by mouth daily.     enoxaparin 40 MG/0.4ML injection  Commonly known as:  LOVENOX  Inject 0.4 mLs (40 mg total) into the skin daily.     Fish Oil 1000 MG Caps  Take 1,200 mg by mouth 2 (two) times daily.     GINKOBA PO  Take 120 mg by mouth daily.      levothyroxine 88 MCG tablet  Commonly known as:  SYNTHROID, LEVOTHROID  Take 75 mcg by mouth daily before breakfast.     losartan-hydrochlorothiazide 100-25 MG tablet  Commonly known as:  HYZAAR  Take 1 tablet by mouth daily.     MULTI-VITAMINS Tabs  Take 1 tablet by mouth daily.     oxyCODONE 5 MG immediate release tablet  Commonly known as:  Oxy IR/ROXICODONE  Take 1-2 tablets (5-10 mg total) by mouth every 4 (four) hours as needed for severe pain.     SYSTANE BALANCE OP  Apply 1 drop to eye daily as needed. Both eyes     traMADol 50 MG tablet  Commonly known as:  ULTRAM  Take 50 mg by mouth every 6 (six) hours as needed for moderate pain. Take one to two tablets every 4 to 6 hours     traMADol 50 MG tablet  Commonly known as:  ULTRAM  Take 1-2 tablets (50-100 mg total) by mouth every 4 (four) hours as needed for moderate pain.     vitamin E 400 UNIT capsule  Take 400 Units by mouth.        Diagnostic Studies: Dg Hip Port Unilat With Pelvis 1v Right  07/05/2015  CLINICAL DATA:  Postop from right hip arthroplasty. EXAM: DG HIP (WITH OR WITHOUT PELVIS) 1V PORT RIGHT COMPARISON:  None. FINDINGS: On the 2 submitted views, the femoral and acetabular components of the total hip arthroplasty are well-seated and aligned. There is no acute fracture or evidence of an operative complication. IMPRESSION: Well-aligned right hip arthroplasty. Electronically Signed   By: Lajean Manes M.D.   On: 07/05/2015 12:16    Disposition: 01-Home or Self Care      Discharge Instructions    Diet - low sodium heart healthy    Complete by:  As directed      Increase activity slowly    Complete by:  As directed            Follow-up Information    Follow up with Dereck Leep, MD On 08/17/2015.   Specialty:  Orthopedic Surgery   Why:  at 10:45am       Signed: Waleska Buttery R. 08/03/2015, 7:44 AM

## 2015-08-28 ENCOUNTER — Emergency Department: Payer: Medicare Other

## 2015-08-28 ENCOUNTER — Emergency Department
Admission: EM | Admit: 2015-08-28 | Discharge: 2015-08-28 | Disposition: A | Discharge status: Home / Self Care | Payer: Medicare Other | Attending: Emergency Medicine | Admitting: Emergency Medicine

## 2015-08-28 ENCOUNTER — Encounter: Payer: Self-pay | Admitting: Emergency Medicine

## 2015-08-28 DIAGNOSIS — S62635B Displaced fracture of distal phalanx of left ring finger, initial encounter for open fracture: Secondary | ICD-10-CM | POA: Diagnosis not present

## 2015-08-28 DIAGNOSIS — Y9289 Other specified places as the place of occurrence of the external cause: Secondary | ICD-10-CM | POA: Insufficient documentation

## 2015-08-28 DIAGNOSIS — Y998 Other external cause status: Secondary | ICD-10-CM | POA: Insufficient documentation

## 2015-08-28 DIAGNOSIS — Z7901 Long term (current) use of anticoagulants: Secondary | ICD-10-CM | POA: Diagnosis not present

## 2015-08-28 DIAGNOSIS — I10 Essential (primary) hypertension: Secondary | ICD-10-CM | POA: Diagnosis not present

## 2015-08-28 DIAGNOSIS — Y9389 Activity, other specified: Secondary | ICD-10-CM | POA: Diagnosis not present

## 2015-08-28 DIAGNOSIS — S67195A Crushing injury of left ring finger, initial encounter: Secondary | ICD-10-CM | POA: Diagnosis not present

## 2015-08-28 DIAGNOSIS — Z79899 Other long term (current) drug therapy: Secondary | ICD-10-CM | POA: Diagnosis not present

## 2015-08-28 DIAGNOSIS — S62639B Displaced fracture of distal phalanx of unspecified finger, initial encounter for open fracture: Secondary | ICD-10-CM

## 2015-08-28 DIAGNOSIS — S61315A Laceration without foreign body of left ring finger with damage to nail, initial encounter: Secondary | ICD-10-CM | POA: Insufficient documentation

## 2015-08-28 DIAGNOSIS — S6992XA Unspecified injury of left wrist, hand and finger(s), initial encounter: Secondary | ICD-10-CM | POA: Diagnosis present

## 2015-08-28 DIAGNOSIS — W231XXA Caught, crushed, jammed, or pinched between stationary objects, initial encounter: Secondary | ICD-10-CM | POA: Diagnosis not present

## 2015-08-28 MED ORDER — BACITRACIN ZINC 500 UNIT/GM EX OINT
1.0000 "application " | TOPICAL_OINTMENT | Freq: Two times a day (BID) | CUTANEOUS | Status: DC
  Administered 2015-08-28: 1 via TOPICAL
  Filled 2015-08-28: qty 0.9

## 2015-08-28 MED ORDER — OXYCODONE HCL 5 MG PO TABS
5.0000 mg | ORAL_TABLET | Freq: Once | ORAL | Status: AC
  Administered 2015-08-28: 5 mg via ORAL
  Filled 2015-08-28: qty 1

## 2015-08-28 MED ORDER — LIDOCAINE HCL (PF) 1 % IJ SOLN
5.0000 mL | Freq: Once | INTRAMUSCULAR | Status: DC
  Filled 2015-08-28: qty 5

## 2015-08-28 MED ORDER — CEPHALEXIN 500 MG PO CAPS: 500.0000 mg | ORAL_CAPSULE | Freq: Three times a day (TID) | ORAL | Status: AC

## 2015-08-28 MED ORDER — OXYCODONE HCL 5 MG PO TABS: 5.0000 mg | ORAL_TABLET | Freq: Three times a day (TID) | ORAL | Status: DC | PRN

## 2015-08-28 NOTE — Discharge Instructions (Signed)
Crush Injury, Fingers or Toes °A crush injury to the fingers or toes means the tissues have been damaged by being squeezed (compressed). There will be bleeding into the tissues and swelling. Often, blood will collect under the skin. When this happens, the skin on the finger often dies and may slough off (shed) 1 week to 10 days later. Usually, new skin is growing underneath. If the injury has been too severe and the tissue does not survive, the damaged tissue may begin to turn black over several days.  °Wounds which occur because of the crushing may be stitched (sutured) shut. However, crush injuries are more likely to become infected than other injuries. These wounds may not be closed as tightly as other types of cuts to prevent infection. Nails involved are often lost. These usually grow back over several weeks.  °DIAGNOSIS °X-rays may be taken to see if there is any injury to the bones. °TREATMENT °Broken bones (fractures) may be treated with splinting, depending on the fracture. Often, no treatment is required for fractures of the last bone in the fingers or toes. °HOME CARE INSTRUCTIONS  °· The crushed part should be raised (elevated) above the heart or center of the chest as much as possible for the first several days or as directed. This helps with pain and lessens swelling. Less swelling increases the chances that the crushed part will survive. °· Put ice on the injured area. °¨ Put ice in a plastic bag. °¨ Place a towel between your skin and the bag. °¨ Leave the ice on for 15-20 minutes, 03-04 times a day for the first 2 days. °· Only take over-the-counter or prescription medicines for pain, discomfort, or fever as directed by your caregiver. °· Use your injured part only as directed. °· Change your bandages (dressings) as directed. °· Keep all follow-up appointments as directed by your caregiver. Not keeping your appointment could result in a chronic or permanent injury, pain, and disability. If there is  any problem keeping the appointment, you must call to reschedule. °SEEK IMMEDIATE MEDICAL CARE IF:  °· There is redness, swelling, or increasing pain in the wound area. °· Pus is coming from the wound. °· You have a fever. °· You notice a bad smell coming from the wound or dressing. °· The edges of the wound do not stay together after the sutures have been removed. °· You are unable to move the injured finger or toe. °MAKE SURE YOU:  °· Understand these instructions. °· Will watch your condition. °· Will get help right away if you are not doing well or get worse. °  °This information is not intended to replace advice given to you by your health care provider. Make sure you discuss any questions you have with your health care provider. °  °Document Released: 08/14/2005 Document Revised: 11/06/2011 Document Reviewed: 12/30/2010 °Elsevier Interactive Patient Education ©2016 [employer] ° °

## 2015-08-28 NOTE — ED Provider Notes (Signed)
[facility] Emergency Department Provider Note ____________________________________________  Time seen: Approximately 6:07 PM  I have reviewed the triage vital signs and the nursing notes.   HISTORY  Chief Complaint Laceration   HPI Latoya Douglas is a 74 y.o. female who presents to the emergency department for evaluation of left ring finger pain. She smashed it in a car door. Laceration to the edge of the nailbed.   Past Medical History  Diagnosis Date  . Hypothyroidism   . Hyperlipidemia   . Hypertension   . DVT (deep venous thrombosis) ([place])     in arm (from IV)  "Years ago"  . Frequent UTI   . Arthritis   . PONV (postoperative nausea and vomiting)   . Cough     post nasal drip  . Cancer [facility])     skin cancer on forehead/face    Patient Active Problem List   Diagnosis Date Noted  . S/P total hip arthroplasty 07/05/2015  . Lumbar spondylosis 10/15/2014  . Lumbar canal stenosis 06/23/2014  . DDD (degenerative disc disease), lumbar 06/03/2014  . Neuritis or radiculitis due to rupture of lumbar intervertebral disc 06/03/2014    Past Surgical History  Procedure Laterality Date  . Eye surgery Bilateral     cataract surgery with lens implant  . Colonoscopy    . Breast surgery      biopsy - benign  . Appendectomy    . Cesarean section      x2  . Abdominal hysterectomy    . Dilation and curettage of uterus    . Lumbar laminectomy/decompression microdiscectomy Right 10/15/2014    Procedure: Laminectomy and Foraminotomy - L2-L3 - right - L3-L4 - bilateral;  Surgeon: Faythe Ghee, MD;  Location: [facility];  Service: Neurosurgery;  Laterality: Right;  Laminectomy and Foraminotomy - L2-L3 - right - L3-L4 - bilateral  . Total hip arthroplasty Right 07/05/2015    Procedure: TOTAL HIP ARTHROPLASTY;  Surgeon: Dereck Leep, MD;  Location: [facility] ORS;  Service: Orthopedics;  Laterality: Right;    Current Outpatient Rx  Name  Route  Sig   Dispense  Refill  . acetaminophen (TYLENOL) 650 MG CR tablet   Oral   Take 650 mg by mouth daily after supper. 1 or 2         . atorvastatin (LIPITOR) 40 MG tablet   Oral   Take 40 mg by mouth daily.          . calcium-vitamin D (CALCIUM 500/D) 500-200 MG-UNIT per tablet   Oral   Take 1 tablet by mouth daily with breakfast.          . cephALEXin (KEFLEX) 500 MG capsule   Oral   Take 1 capsule (500 mg total) by mouth 3 (three) times daily.   40 capsule   0   . cetirizine (ZYRTEC) 10 MG tablet   Oral   Take 10 mg by mouth daily.         Marland Kitchen enoxaparin (LOVENOX) 40 MG/0.4ML injection   Subcutaneous   Inject 0.4 mLs (40 mg total) into the skin daily.   14 Syringe   0   . Ginkgo Biloba (GINKOBA PO)   Oral   Take 120 mg by mouth daily.         Marland Kitchen levothyroxine (SYNTHROID, LEVOTHROID) 88 MCG tablet   Oral   Take 75 mcg by mouth daily before breakfast.          . losartan-hydrochlorothiazide (  HYZAAR) 100-25 MG per tablet   Oral   Take 1 tablet by mouth daily.          . Multiple Vitamin (MULTI-VITAMINS) TABS   Oral   Take 1 tablet by mouth daily.          . Omega-3 Fatty Acids (FISH OIL) 1000 MG CAPS   Oral   Take 1,200 mg by mouth 2 (two) times daily.          Marland Kitchen oxyCODONE (ROXICODONE) 5 MG immediate release tablet   Oral   Take 1 tablet (5 mg total) by mouth every 8 (eight) hours as needed.   20 tablet   0   . Propylene Glycol (SYSTANE BALANCE OP)   Ophthalmic   Apply 1 drop to eye daily as needed. Both eyes         . traMADol (ULTRAM) 50 MG tablet   Oral   Take 50 mg by mouth every 6 (six) hours as needed for moderate pain. Take one to two tablets every 4 to 6 hours         . traMADol (ULTRAM) 50 MG tablet   Oral   Take 1-2 tablets (50-100 mg total) by mouth every 4 (four) hours as needed for moderate pain.   60 tablet   1   . vitamin E 400 UNIT capsule   Oral   Take 400 Units by mouth.            Allergies Tape  Family  History  Problem Relation Age of Onset  . Heart attack Mother   . Cancer Father   . Heart attack Brother     Social History Social History  Substance Use Topics  . Smoking status: Never Smoker   . Smokeless tobacco: Never Used  . Alcohol Use: No    Review of Systems   Constitutional: No fever/chills Eyes: No visual changes. ENT: No congestion or rhinorrhea Cardiovascular: Denies chest pain. Respiratory: Denies shortness of breath. Gastrointestinal: No abdominal pain.  No nausea, no vomiting.  No diarrhea.  No constipation. Genitourinary: Negative for dysuria. Musculoskeletal: Negative for back pain. Skin: Positive for laceration to the left ring finger Neurological: Negative for headaches, focal weakness or numbness.  10-point ROS otherwise negative.  ____________________________________________   PHYSICAL EXAM:  VITAL SIGNS: ED Triage Vitals  Enc Vitals Group     BP 08/28/15 1643 138/65 mmHg     Pulse Rate 08/28/15 1643 78     Resp 08/28/15 1643 18     Temp 08/28/15 1643 98 F (36.7 C)     Temp Source 08/28/15 1643 Oral     SpO2 08/28/15 1643 100 %     Weight 08/28/15 1643 145 lb (65.772 kg)     Height 08/28/15 1643 5\' 5"  (1.651 m)     Head Cir --      Peak Flow --      Pain Score 08/28/15 1644 6     Pain Loc --      Pain Edu? --      Excl. in [place]? --    Constitutional: Alert and oriented. Well appearing and in no acute distress. Eyes: Conjunctivae are normal. PERRL. EOMI. Head: Atraumatic. Nose: No congestion/rhinnorhea. Mouth/Throat: Mucous membranes are moist. Neck: No stridor. Cardiovascular: Normal rate, regular rhythm.  Good peripheral circulation. Respiratory: Normal respiratory effort.  No retractions.  Gastrointestinal: Soft and nontender. Musculoskeletal: No lower extremity tenderness nor edema.  No joint effusions. Neurologic:  Normal speech and language.  No gross focal neurologic deficits are appreciated. Speech is normal. No gait  instability. Skin:  Stellate laceration to the left ring finger at the distal end, measuring approximately 2 cm; Negative for petechiae.  Psychiatric: Mood and affect are normal. Speech and behavior are normal.  ____________________________________________   LABS (all labs ordered are listed, but only abnormal results are displayed)  Labs Reviewed - No data to display ____________________________________________  EKG   ____________________________________________  RADIOLOGY  Fracture of the distal tuft of the fourth digit noted on the left hand.  I, Sherrie George, personally viewed and evaluated these images (plain radiographs) as part of my medical decision making, as well as reviewing the written report by the radiologist.  ____________________________________________   PROCEDURES  Procedure(s) performed: Aluminum and foam splint applied to the left fourth digit by ER tech. ____________________________________________   INITIAL IMPRESSION / [place] / ED COURSE  Pertinent labs & imaging results that were available during my care of the patient were reviewed by me and considered in my medical decision making (see chart for details).  Patient was advised to follow-up with orthopedics. She already has a follow-up appointment on January 9 with her primary care provider. She was advised to have him evaluate her sutures at that appointment and remove them if the wound appears to have healed.  She was advised to return to the emergency department for symptoms that change or worsen if she is unable to schedule an appointment. ____________________________________________   FINAL CLINICAL IMPRESSION(S) / ED DIAGNOSES  Final diagnoses:  Open fracture of tuft of distal phalanx of finger, initial encounter  Crushing injury of left ring finger, initial encounter       Victorino Dike, FNP 08/28/15 2119  Carrie Mew, MD 08/28/15 2246

## 2015-08-28 NOTE — ED Notes (Signed)
Reports smashing finger in car door.  Lac near nailbed on left ring finger.

## 2015-08-31 MED ORDER — OXYCODONE HCL 5 MG PO TABS: 5.0000 mg | ORAL_TABLET | Freq: Three times a day (TID) | ORAL | Status: AC | PRN

## 2015-09-22 ENCOUNTER — Other Ambulatory Visit: Payer: Self-pay | Admitting: Internal Medicine

## 2015-09-22 DIAGNOSIS — Z1231 Encounter for screening mammogram for malignant neoplasm of breast: Secondary | ICD-10-CM

## 2015-09-29 ENCOUNTER — Other Ambulatory Visit: Payer: Self-pay | Admitting: Internal Medicine

## 2015-09-29 ENCOUNTER — Ambulatory Visit
Admission: RE | Admit: 2015-09-29 | Discharge: 2015-09-29 | Disposition: A | Discharge status: Home / Self Care | Payer: Medicare Other | Source: Ambulatory Visit | Attending: Internal Medicine | Admitting: Internal Medicine

## 2015-09-29 DIAGNOSIS — Z1231 Encounter for screening mammogram for malignant neoplasm of breast: Secondary | ICD-10-CM | POA: Diagnosis present

## 2016-06-09 IMAGING — MG MM DIGITAL SCREENING BILAT W/ TOMO W/ CAD
8 of 12 series · 8 of 28 positions shown · non-contrast
Comparison: Previous exam(s).

CLINICAL DATA: Screening.

EXAM:
DIGITAL SCREENING BILATERAL MAMMOGRAM WITH 3D TOMO WITH CAD

[L MLO synth-2D]
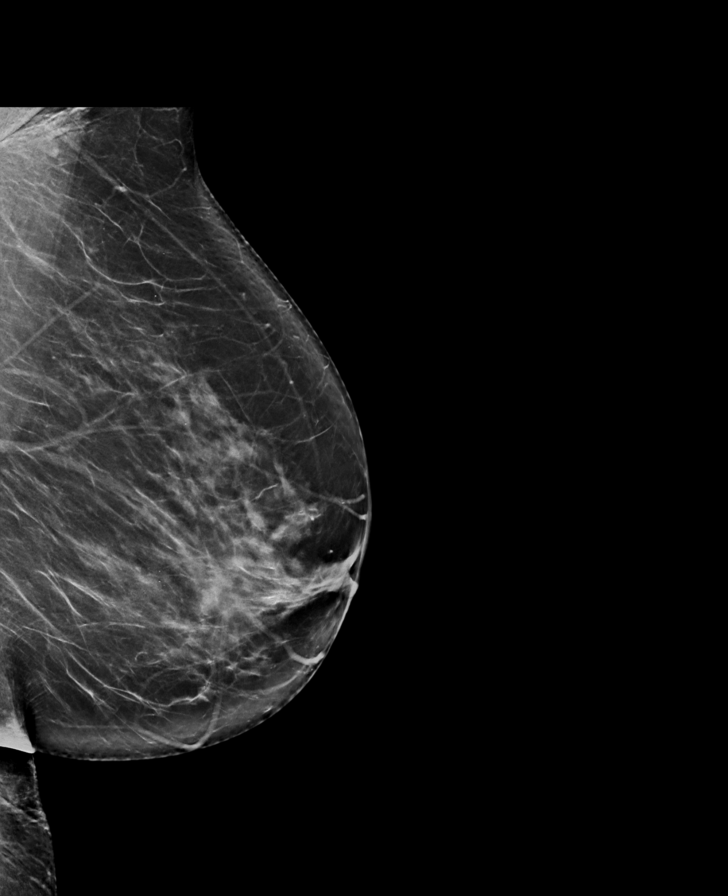

[R CC]
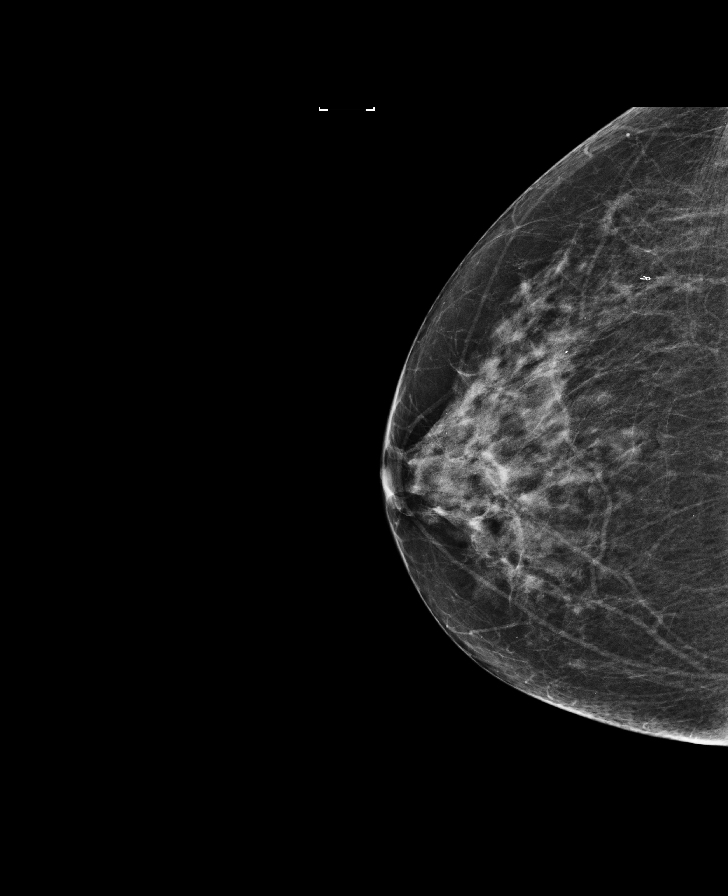

[L MLO]
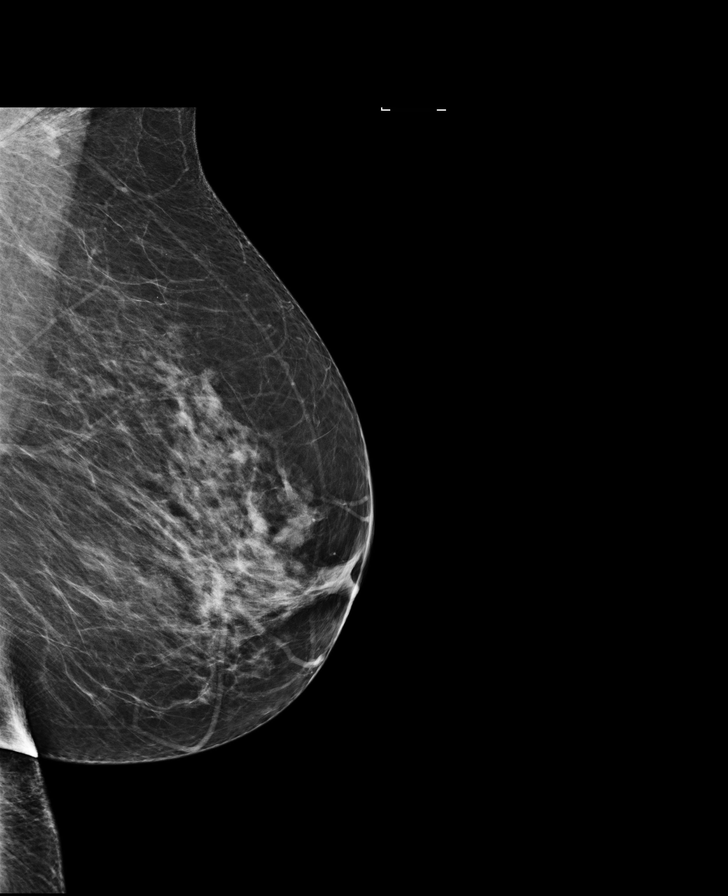

[R MLO synth-2D]
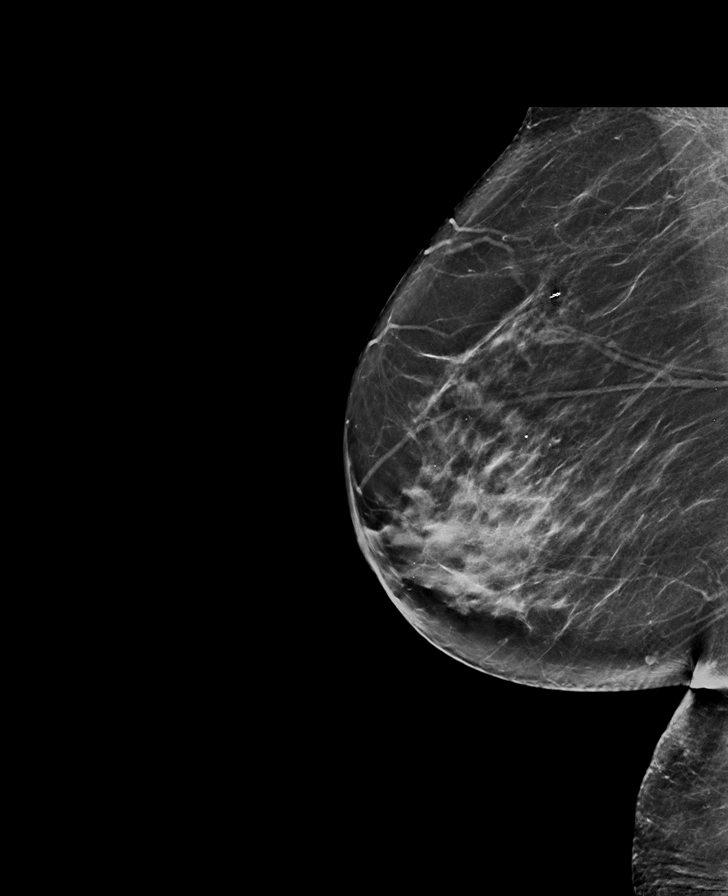

[R CC synth-2D]
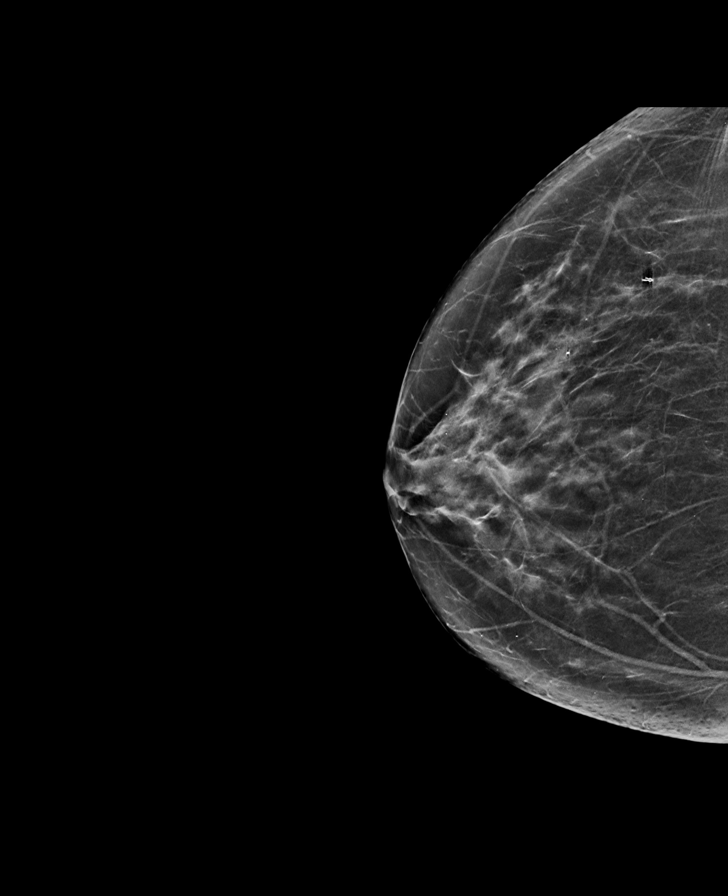

[R MLO]
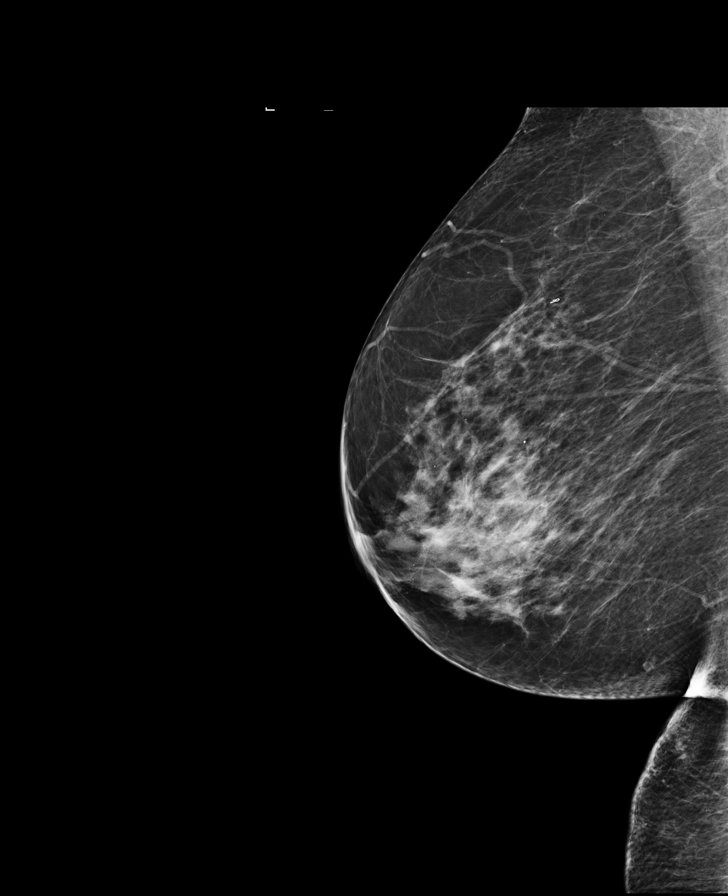

[L CC]
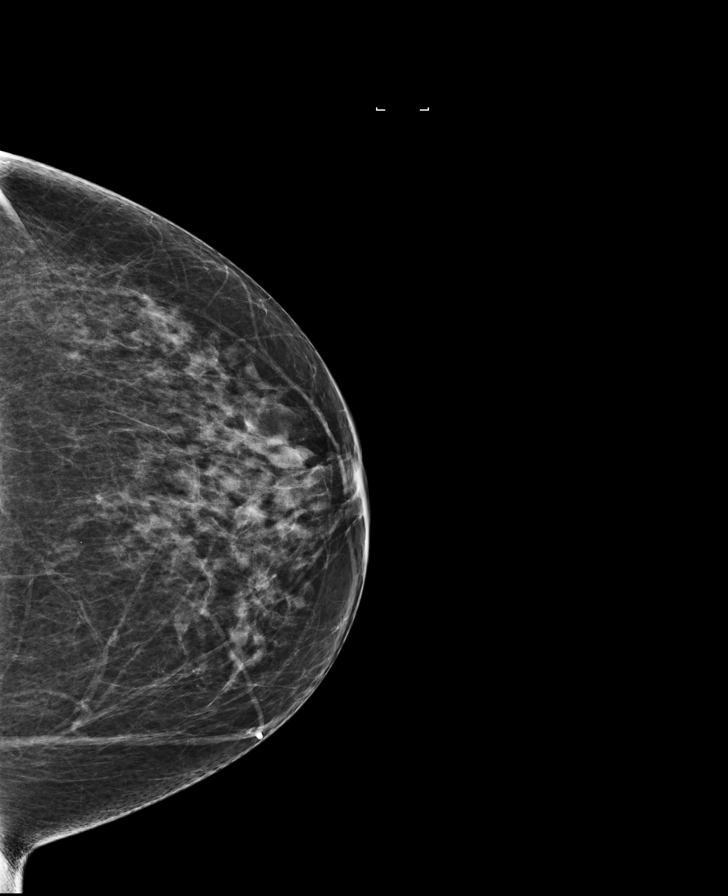

[L CC synth-2D]
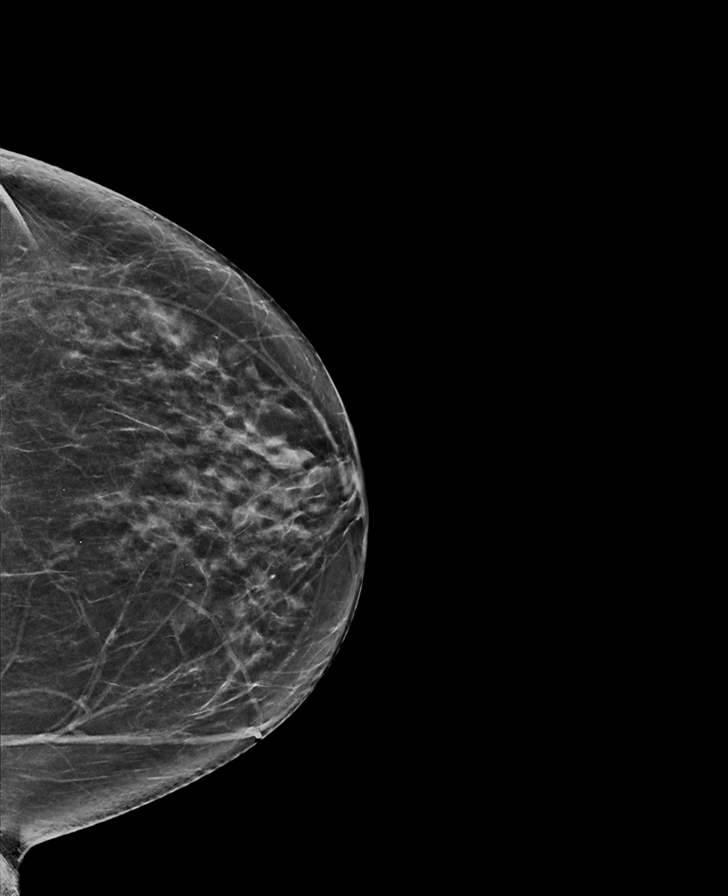

[8 of 28 positions shown; findings below may reference images not displayed]

ACR Breast Density Category c: The breast tissue is heterogeneously
dense, which may obscure small masses.
FINDINGS: There are no findings suspicious for malignancy. Images were
processed with CAD.
IMPRESSION: No mammographic evidence of malignancy. A result letter of this
screening mammogram will be mailed directly to the patient.

RECOMMENDATION:
Screening mammogram in one year. (Code:OA-G-1SS)

BI-RADS CATEGORY  1: Negative.

## 2016-10-02 ENCOUNTER — Other Ambulatory Visit: Payer: Self-pay | Admitting: Internal Medicine

## 2016-10-02 DIAGNOSIS — Z1231 Encounter for screening mammogram for malignant neoplasm of breast: Secondary | ICD-10-CM

## 2016-10-23 ENCOUNTER — Ambulatory Visit
Admission: RE | Admit: 2016-10-23 | Discharge: 2016-10-23 | Disposition: A | Discharge status: Home / Self Care | Payer: Medicare Other | Source: Ambulatory Visit | Attending: Internal Medicine | Admitting: Internal Medicine

## 2016-10-23 DIAGNOSIS — Z1231 Encounter for screening mammogram for malignant neoplasm of breast: Secondary | ICD-10-CM

## 2016-10-26 ENCOUNTER — Emergency Department
Admission: EM | Admit: 2016-10-26 | Discharge: 2016-10-26 | Disposition: A | Discharge status: Home / Self Care | Payer: Medicare Other | Attending: Emergency Medicine | Admitting: Emergency Medicine

## 2016-10-26 DIAGNOSIS — L509 Urticaria, unspecified: Secondary | ICD-10-CM | POA: Insufficient documentation

## 2016-10-26 DIAGNOSIS — R21 Rash and other nonspecific skin eruption: Secondary | ICD-10-CM | POA: Diagnosis present

## 2016-10-26 DIAGNOSIS — Z79899 Other long term (current) drug therapy: Secondary | ICD-10-CM | POA: Diagnosis not present

## 2016-10-26 DIAGNOSIS — Z85828 Personal history of other malignant neoplasm of skin: Secondary | ICD-10-CM | POA: Diagnosis not present

## 2016-10-26 DIAGNOSIS — I10 Essential (primary) hypertension: Secondary | ICD-10-CM | POA: Insufficient documentation

## 2016-10-26 DIAGNOSIS — E039 Hypothyroidism, unspecified: Secondary | ICD-10-CM | POA: Insufficient documentation

## 2016-10-26 LAB — CBC WITH DIFFERENTIAL/PLATELET
Basophils Absolute: 0 10*3/uL (ref 0–0.1)
Basophils Relative: 0 %
EOS PCT: 1 %
Eosinophils Absolute: 0.2 10*3/uL (ref 0–0.7)
HCT: 38 % (ref 35.0–47.0)
HEMOGLOBIN: 13.3 g/dL (ref 12.0–16.0)
LYMPHS ABS: 2.6 10*3/uL (ref 1.0–3.6)
LYMPHS PCT: 21 %
MCH: 32.5 pg (ref 26.0–34.0)
MCHC: 35.1 g/dL (ref 32.0–36.0)
MCV: 92.4 fL (ref 80.0–100.0)
Monocytes Absolute: 0.8 10*3/uL (ref 0.2–0.9)
Monocytes Relative: 6 %
NEUTROS PCT: 72 %
Neutro Abs: 9 10*3/uL — ABNORMAL HIGH (ref 1.4–6.5)
Platelets: 268 10*3/uL (ref 150–440)
RBC: 4.11 MIL/uL (ref 3.80–5.20)
RDW: 12.5 % (ref 11.5–14.5)
WBC: 12.5 10*3/uL — AB (ref 3.6–11.0)

## 2016-10-26 LAB — COMPREHENSIVE METABOLIC PANEL
ALK PHOS: 58 U/L (ref 38–126)
ALT: 38 U/L (ref 14–54)
AST: 38 U/L (ref 15–41)
Albumin: 4.3 g/dL (ref 3.5–5.0)
Anion gap: 9 (ref 5–15)
BILIRUBIN TOTAL: 1.1 mg/dL (ref 0.3–1.2)
BUN: 15 mg/dL (ref 6–20)
CALCIUM: 9.5 mg/dL (ref 8.9–10.3)
CO2: 26 mmol/L (ref 22–32)
Chloride: 102 mmol/L (ref 101–111)
Creatinine, Ser: 0.51 mg/dL (ref 0.44–1.00)
Glucose, Bld: 99 mg/dL (ref 65–99)
Potassium: 3.8 mmol/L (ref 3.5–5.1)
Sodium: 137 mmol/L (ref 135–145)
Total Protein: 7.6 g/dL (ref 6.5–8.1)

## 2016-10-26 LAB — URINALYSIS, COMPLETE (UACMP) WITH MICROSCOPIC
BILIRUBIN URINE: NEGATIVE
Glucose, UA: NEGATIVE mg/dL
HGB URINE DIPSTICK: NEGATIVE
Ketones, ur: NEGATIVE mg/dL
NITRITE: NEGATIVE
PH: 6 (ref 5.0–8.0)
Protein, ur: NEGATIVE mg/dL
SPECIFIC GRAVITY, URINE: 1.009 (ref 1.005–1.030)

## 2016-10-26 LAB — T4, FREE: Free T4: 0.92 ng/dL (ref 0.61–1.12)

## 2016-10-26 MED ORDER — SODIUM CHLORIDE 0.9 % IV SOLN
Freq: Once | INTRAVENOUS | Status: AC
  Administered 2016-10-26: 21:00:00 via INTRAVENOUS

## 2016-10-26 MED ORDER — METHYLPREDNISOLONE 4 MG PO TBPK: ORAL_TABLET | ORAL | 0 refills | Status: DC

## 2016-10-26 MED ORDER — METHYLPREDNISOLONE SODIUM SUCC 125 MG IJ SOLR
125.0000 mg | Freq: Once | INTRAMUSCULAR | Status: AC
  Administered 2016-10-26: 125 mg via INTRAVENOUS
  Filled 2016-10-26: qty 2

## 2016-10-26 MED ORDER — FAMOTIDINE IN NACL 20-0.9 MG/50ML-% IV SOLN
20.0000 mg | Freq: Once | INTRAVENOUS | Status: AC
  Administered 2016-10-26: 20 mg via INTRAVENOUS
  Filled 2016-10-26: qty 50

## 2016-10-26 MED ORDER — DIPHENHYDRAMINE HCL 50 MG/ML IJ SOLN
50.0000 mg | Freq: Once | INTRAMUSCULAR | Status: AC
  Administered 2016-10-26: 50 mg via INTRAVENOUS
  Filled 2016-10-26: qty 1

## 2016-10-26 NOTE — ED Notes (Signed)
MD Williams at bedside.  

## 2016-10-26 NOTE — ED Provider Notes (Signed)
[facility] Emergency Department Provider Note        Time seen: ----------------------------------------- 8:22 PM on 10/26/2016 -----------------------------------------    I have reviewed the triage vital signs and the nursing notes.   HISTORY  Chief Complaint Allergic Reaction    HPI Latoya ERTL is a 76 y.o. female who a generalized rash and redness to the right side of her face and around her eyes. Patient reports her lips feel puffy but no swelling was noted in triage. She's also had mid back and left arm pain episodes today which she states is unsure of the cause. She denies any fevers, chills, chest pain, shortness of breath or other specific complaint. She denies changes in her soaps, detergents, foods or any recent environmental allergy. She's recently been placed on antibiotics for a UTI.   Past Medical History:  Diagnosis Date  . Arthritis   . Cancer ([place])    skin cancer on forehead/face  . Cough    post nasal drip  . DVT (deep venous thrombosis) ([place])    in arm (from IV)  "Years ago"  . Frequent UTI   . Hyperlipidemia   . Hypertension   . Hypothyroidism   . PONV (postoperative nausea and vomiting)     Patient Active Problem List   Diagnosis Date Noted  . S/P total hip arthroplasty 07/05/2015  . Lumbar spondylosis 10/15/2014  . Lumbar canal stenosis 06/23/2014  . DDD (degenerative disc disease), lumbar 06/03/2014  . Neuritis or radiculitis due to rupture of lumbar intervertebral disc 06/03/2014    Past Surgical History:  Procedure Laterality Date  . ABDOMINAL HYSTERECTOMY    . APPENDECTOMY    . BREAST BIOPSY Right 10+ YRS AGO   NEG  . BREAST SURGERY     biopsy - benign  . CESAREAN SECTION     x2  . COLONOSCOPY    . DILATION AND CURETTAGE OF UTERUS    . EYE SURGERY Bilateral    cataract surgery with lens implant  . LUMBAR LAMINECTOMY/DECOMPRESSION MICRODISCECTOMY Right 10/15/2014   Procedure: Laminectomy and  Foraminotomy - L2-L3 - right - L3-L4 - bilateral;  Surgeon: Faythe Ghee, MD;  Location: [facility];  Service: Neurosurgery;  Laterality: Right;  Laminectomy and Foraminotomy - L2-L3 - right - L3-L4 - bilateral  . TOTAL HIP ARTHROPLASTY Right 07/05/2015   Procedure: TOTAL HIP ARTHROPLASTY;  Surgeon: Dereck Leep, MD;  Location: [facility] ORS;  Service: Orthopedics;  Laterality: Right;    Allergies Tape  Social History Social History  Substance Use Topics  . Smoking status: Never Smoker  . Smokeless tobacco: Never Used  . Alcohol use No    Review of Systems Constitutional: Negative for fever. ENT: Positive for lip swelling Cardiovascular: Negative for chest pain. Respiratory: Negative for shortness of breath. Gastrointestinal: Negative for abdominal pain, vomiting and diarrhea. Genitourinary: Negative for dysuria. Musculoskeletal: Positive for back pain Skin: Positive for rash Neurological: Negative for headaches, focal weakness or numbness.  10-point ROS otherwise negative.  ____________________________________________   PHYSICAL EXAM:  VITAL SIGNS: ED Triage Vitals [10/26/16 1948]  Enc Vitals Group     BP 136/67     Pulse Rate 71     Resp 20     Temp 99.2 F (37.3 C)     Temp Source Oral     SpO2 98 %     Weight 158 lb (71.7 kg)     Height 5\' 6"  (1.676 m)     Head  Circumference      Peak Flow      Pain Score      Pain Loc      Pain Edu?      Excl. in [place]?    Constitutional: Alert and oriented. Well appearing and in no distress. Eyes: Conjunctivae are normal. PERRL. Normal extraocular movements. ENT   Head: Normocephalic and atraumatic.Mild right facial and left periorbital erythema   Nose: No congestion/rhinnorhea.   Mouth/Throat: Mucous membranes are moist.Possible mild left lower lip swelling   Neck: No stridor. Cardiovascular: Normal rate, regular rhythm. No murmurs, rubs, or gallops. Respiratory: Normal respiratory effort without tachypnea  nor retractions. Breath sounds are clear and equal bilaterally. No wheezes/rales/rhonchi. Gastrointestinal: Soft and nontender. Normal bowel sounds Musculoskeletal: Nontender with normal range of motion in all extremities. No lower extremity tenderness nor edema. Neurologic:  Normal speech and language. No gross focal neurologic deficits are appreciated.  Skin:  Scattered hives are noted around the left arm, chest, low back and legs Psychiatric: Mood and affect are normal. Speech and behavior are normal.  ____________________________________________  EKG: Interpreted by me. Sinus rhythm rate 80 bpm, normal PR interval, normal QRS, normal QT, left axis deviation, left anterior fascicular block  ____________________________________________  ED COURSE:  Pertinent labs & imaging results that were available during my care of the patient were reviewed by me and considered in my medical decision making (see chart for details). Patient presents to ER with hives of uncertain etiology. We will assess with basic labs and give IV Benadryl and steroids.   Procedures ____________________________________________   LABS (pertinent positives/negatives)  Labs Reviewed  CBC WITH DIFFERENTIAL/PLATELET - Abnormal; Notable for the following:       Result Value   WBC 12.5 (*)    Neutro Abs 9.0 (*)    All other components within normal limits  URINALYSIS, COMPLETE (UACMP) WITH MICROSCOPIC - Abnormal; Notable for the following:    Color, Urine YELLOW (*)    APPearance CLEAR (*)    Leukocytes, UA MODERATE (*)    Bacteria, UA RARE (*)    Squamous Epithelial / LPF 0-5 (*)    All other components within normal limits  COMPREHENSIVE METABOLIC PANEL  T4, FREE   ____________________________________________  FINAL ASSESSMENT AND PLAN  Urticaria  Plan: Patient with labs as dictated above. Urticaria has improved after Benadryl and steroids. She is stable for outpatient follow-up on a steroid taper and  Benadryl as needed for itching and hives.   Latoya Newport, MD   Note: This note was generated in part or whole with voice recognition software. Voice recognition is usually quite accurate but there are transcription errors that can and very often do occur. I apologize for any typographical errors that were not detected and corrected.     Latoya Newport, MD 10/26/16 2158

## 2016-10-26 NOTE — ED Triage Notes (Addendum)
Pt in with rash throughout and redness to right face and around eyes. States lips feel "puffy but no swelling noted to mouth, no shob noted. Pt states she also had mid back and left arm pain episodes today, unsure of cause.

## 2016-10-26 NOTE — ED Notes (Signed)
20g iv d'ced from left forearm.  Pt alert.  D/c inst to pt and family.

## 2016-12-12 DIAGNOSIS — S62619A Displaced fracture of proximal phalanx of unspecified finger, initial encounter for closed fracture: Secondary | ICD-10-CM | POA: Insufficient documentation

## 2017-08-30 DIAGNOSIS — C44310 Basal cell carcinoma of skin of unspecified parts of face: Secondary | ICD-10-CM | POA: Insufficient documentation

## 2017-11-05 ENCOUNTER — Other Ambulatory Visit: Payer: Self-pay | Admitting: Internal Medicine

## 2017-11-05 DIAGNOSIS — Z1231 Encounter for screening mammogram for malignant neoplasm of breast: Secondary | ICD-10-CM

## 2017-11-21 ENCOUNTER — Ambulatory Visit
Admission: RE | Admit: 2017-11-21 | Discharge: 2017-11-21 | Disposition: A | Discharge status: Home / Self Care | Payer: Medicare Other | Source: Ambulatory Visit | Attending: Internal Medicine | Admitting: Internal Medicine

## 2017-11-21 DIAGNOSIS — Z1231 Encounter for screening mammogram for malignant neoplasm of breast: Secondary | ICD-10-CM | POA: Insufficient documentation

## 2017-12-04 ENCOUNTER — Encounter: Payer: Self-pay | Admitting: Gastroenterology

## 2017-12-04 ENCOUNTER — Ambulatory Visit: Payer: Medicare Other | Admitting: Gastroenterology

## 2017-12-04 ENCOUNTER — Other Ambulatory Visit: Payer: Self-pay

## 2017-12-04 VITALS — BP 160/85 | HR 67 | Temp 98.1°F | Ht 66.0 in | Wt 156.8 lb

## 2017-12-04 DIAGNOSIS — R110 Nausea: Secondary | ICD-10-CM | POA: Diagnosis not present

## 2017-12-04 DIAGNOSIS — R194 Change in bowel habit: Secondary | ICD-10-CM | POA: Diagnosis not present

## 2017-12-04 DIAGNOSIS — R112 Nausea with vomiting, unspecified: Secondary | ICD-10-CM

## 2017-12-04 NOTE — Progress Notes (Signed)
Gastroenterology Consultation  Referring Provider:     Casilda Carls, MD Primary Care Physician:  Casilda Carls, MD Primary Gastroenterologist:  Dr. Allen Norris     Reason for Consultation:     Change in bowel habit And nausea        HPI:   Latoya Douglas is a 77 y.o. y/o female referred for consultation & management of Change in bowel habits and nausea by Dr. Casilda Carls, MD.  This patient comes in with a history of change in bowel habits.  The patient had seen me in the past for colonoscopy back in 2009. She reports that now she has been having symptoms of morning nausea with loose bowel movements.  She states that the loose bowel movements are usually twice a day but of small volume.  She reports that the symptoms will wake her up in the early morning.  She is on a PPI and does not report of that helps her nausea. There is no report of any black stools or bloody stools.  The patient does report that she has lost some weight without trying.   Past Medical History:  Diagnosis Date  . Arthritis   . Cancer ([place])    skin cancer on forehead/face  . Cough    post nasal drip  . DVT (deep venous thrombosis) ([place])    in arm (from IV)  "Years ago"  . Frequent UTI   . Hyperlipidemia   . Hypertension   . Hypothyroidism   . PONV (postoperative nausea and vomiting)     Past Surgical History:  Procedure Laterality Date  . ABDOMINAL HYSTERECTOMY    . APPENDECTOMY    . BREAST BIOPSY Right 10+ YRS AGO   NEG  . BREAST SURGERY     biopsy - benign  . CESAREAN SECTION     x2  . COLONOSCOPY    . DILATION AND CURETTAGE OF UTERUS    . EYE SURGERY Bilateral    cataract surgery with lens implant  . LUMBAR LAMINECTOMY/DECOMPRESSION MICRODISCECTOMY Right 10/15/2014   Procedure: Laminectomy and Foraminotomy - L2-L3 - right - L3-L4 - bilateral;  Surgeon: Faythe Ghee, MD;  Location: [place];  Service: Neurosurgery;  Laterality: Right;  Laminectomy and Foraminotomy - L2-L3 - right - L3-L4 -  bilateral  . TOTAL HIP ARTHROPLASTY Right 07/05/2015   Procedure: TOTAL HIP ARTHROPLASTY;  Surgeon: Dereck Leep, MD;  Location: [facility] ORS;  Service: Orthopedics;  Laterality: Right;    Prior to Admission medications   Medication Sig Start Date End Date Taking? Authorizing Provider  acetaminophen (TYLENOL) 650 MG CR tablet Take 650 mg by mouth daily after supper. 1 or 2   Yes [provider]  atorvastatin (LIPITOR) 40 MG tablet Take 40 mg by mouth daily.    Yes [provider]  calcium-vitamin D (CALCIUM 500/D) 500-200 MG-UNIT per tablet Take 1 tablet by mouth daily with breakfast.    Yes [provider]  Ginkgo Biloba (GINKOBA PO) Take 120 mg by mouth daily.   Yes [provider]  levothyroxine (SYNTHROID, LEVOTHROID) 88 MCG tablet Take 75 mcg by mouth daily before breakfast.    Yes [provider]  losartan-hydrochlorothiazide (HYZAAR) 100-25 MG per tablet Take 1 tablet by mouth daily.    Yes [provider]  Multiple Vitamin (MULTI-VITAMINS) TABS Take 1 tablet by mouth daily.    Yes [provider]  Omega-3 Fatty Acids (FISH OIL) 1000 MG CAPS Take 1,200 mg by  mouth 2 (two) times daily.    Yes [provider]  cetirizine (ZYRTEC) 10 MG tablet Take 10 mg by mouth daily.    [provider]  enoxaparin (LOVENOX) 40 MG/0.4ML injection Inject 0.4 mLs (40 mg total) into the skin daily. Patient not taking: Reported on 12/04/2017 07/07/15   Watt Climes, PA  methylPREDNISolone (MEDROL) 4 MG TBPK tablet Dispense Medrol Dosepak as directed Patient not taking: Reported on 12/04/2017 10/26/16   Earleen Newport, MD  Propylene Glycol (SYSTANE BALANCE OP) Apply 1 drop to eye daily as needed. Both eyes    [provider]  traMADol (ULTRAM) 50 MG tablet Take 50 mg by mouth every 6 (six) hours as needed for moderate pain. Take one to two tablets every 4 to 6 hours    [provider]  traMADol (ULTRAM) 50 MG tablet  Take 1-2 tablets (50-100 mg total) by mouth every 4 (four) hours as needed for moderate pain. Patient not taking: Reported on 12/04/2017 07/07/15   Watt Climes, PA  vitamin E 400 UNIT capsule Take 400 Units by mouth.     [provider]    Family History  Problem Relation Age of Onset  . Heart attack Mother   . Cancer Father   . Heart attack Brother   . Breast cancer Neg Hx      Social History   Tobacco Use  . Smoking status: Never Smoker  . Smokeless tobacco: Never Used  Substance Use Topics  . Alcohol use: No  . Drug use: No    Allergies as of 12/04/2017 - Review Complete 12/04/2017  Allergen Reaction Noted  . Tape Itching 10/07/2014    Review of Systems:    All systems reviewed and negative except where noted in HPI.   Physical Exam:  BP (!) 160/85   Pulse 67   Temp 98.1 F (36.7 C) (Oral)   Ht 5\' 6"  (1.676 m)   Wt 156 lb 12.8 oz (71.1 kg)   BMI 25.31 kg/m  No LMP recorded. Patient has had a hysterectomy. Psych:  Alert and cooperative. Normal mood and affect. General:   Alert,  Well-developed, ell-nourished, pleasant and cooperative in NAD Head:  Normocephalic and atraumatic. Eyes:  Sclera clear, no icterus.   Conjunctiva pink. Ears:  Normal auditory acuity. Nose:  No deformity, discharge, or lesions. Mouth:  No deformity or lesions,oropharynx pink & moist. Neck:  Supple; no masses or thyromegaly. Lungs:  Respirations even and unlabored.  Clear throughout to auscultation.   No wheezes, crackles, or rhonchi. No acute distress. Heart:  Regular rate and rhythm; no murmurs, clicks, rubs, or gallops. Abdomen:  Normal bowel sounds.  No bruits.  Soft, non-tender and non-distended without masses, hepatosplenomegaly or hernias noted.  No guarding or rebound tenderness.  wNegative Carnett sign.   Rectal:  Deferred.  Msk:  Symmetrical without gross deformities.  Good, equal movement & strength bilaterally. Pulses:  Normal pulses noted. Extremities:  No clubbing  or edema.  No cyanosis. Neurologic:  Alert and oriented x3;  grossly normal neurologically. Skin:  Intact without significant lesions or rashes.  No jaundice. Lymph Nodes:  No significant cervical adenopathy. Psych:  Alert and cooperative. Normal mood and affect.  Imaging Studies: Mm Digital Screening Bilateral  Result Date: 11/21/2017 CLINICAL DATA:  Screening. EXAM: DIGITAL SCREENING BILATERAL MAMMOGRAM WITH CAD COMPARISON:  Previous exam(s). ACR Breast Density Category c: The breast tissue is heterogeneously dense, which may obscure small masses. FINDINGS: There are no  findings suspicious for malignancy. Images were processed with CAD. IMPRESSION: No mammographic evidence of malignancy. A result letter of this screening mammogram will be mailed directly to the patient. RECOMMENDATION: Screening mammogram in one year. (Code:SM-B-01Y) BI-RADS CATEGORY  1: Negative. Electronically Signed   By: Nolon Nations M.D.   On: 11/21/2017 11:17    Assessment and Plan:   MICCA MATURA is a 77 y.o. y/o female Who comes in with a change in bowel habits.  The patient states that her bowel habits have become more soft and smaller in amount.  The patient will be set up for an EGD and colonoscopy to look for a cause of her nausea and change in bowel habits.  The patient has been told that she may have microscopic colitis.  She denies dairy intake except for some cheese and does not notice any change in her diarrhea with dairy or any other food intake. I have discussed risks & benefits which include, but are not limited to, bleeding, infection, perforation & drug reaction.  The patient agrees with this plan & written consent will be obtained.     Lucilla Lame, MD. Mar[facility] Regal   Note: This dictation was prepared with Dragon dictation along with smaller phrase technology. Any transcriptional errors that result from this process are unintentional.

## 2017-12-05 ENCOUNTER — Other Ambulatory Visit: Payer: Self-pay

## 2017-12-05 DIAGNOSIS — R194 Change in bowel habit: Secondary | ICD-10-CM

## 2017-12-10 ENCOUNTER — Telehealth: Payer: Self-pay | Admitting: Gastroenterology

## 2017-12-10 ENCOUNTER — Encounter: Payer: Self-pay | Admitting: Student

## 2017-12-10 NOTE — Telephone Encounter (Signed)
Patient LVM that she has a procedure tomorrow and has some questions to ask you. Please call

## 2017-12-11 ENCOUNTER — Ambulatory Visit
Admission: RE | Admit: 2017-12-11 | Discharge: 2017-12-11 | Disposition: A | Discharge status: Home / Self Care | Payer: Medicare Other | Source: Ambulatory Visit | Attending: Gastroenterology | Admitting: Gastroenterology

## 2017-12-11 ENCOUNTER — Ambulatory Visit: Payer: Medicare Other | Admitting: Anesthesiology

## 2017-12-11 ENCOUNTER — Encounter
Admission: RE | Disposition: A | Discharge status: Home / Self Care | Payer: Self-pay | Source: Ambulatory Visit | Attending: Gastroenterology

## 2017-12-11 ENCOUNTER — Encounter: Payer: Self-pay | Admitting: *Deleted

## 2017-12-11 DIAGNOSIS — K2980 Duodenitis without bleeding: Secondary | ICD-10-CM | POA: Insufficient documentation

## 2017-12-11 DIAGNOSIS — Z96641 Presence of right artificial hip joint: Secondary | ICD-10-CM | POA: Insufficient documentation

## 2017-12-11 DIAGNOSIS — M1990 Unspecified osteoarthritis, unspecified site: Secondary | ICD-10-CM | POA: Diagnosis not present

## 2017-12-11 DIAGNOSIS — K449 Diaphragmatic hernia without obstruction or gangrene: Secondary | ICD-10-CM | POA: Diagnosis not present

## 2017-12-11 DIAGNOSIS — K2970 Gastritis, unspecified, without bleeding: Secondary | ICD-10-CM

## 2017-12-11 DIAGNOSIS — K219 Gastro-esophageal reflux disease without esophagitis: Secondary | ICD-10-CM | POA: Diagnosis not present

## 2017-12-11 DIAGNOSIS — Z79899 Other long term (current) drug therapy: Secondary | ICD-10-CM | POA: Insufficient documentation

## 2017-12-11 DIAGNOSIS — Z86718 Personal history of other venous thrombosis and embolism: Secondary | ICD-10-CM | POA: Insufficient documentation

## 2017-12-11 DIAGNOSIS — K640 First degree hemorrhoids: Secondary | ICD-10-CM | POA: Diagnosis not present

## 2017-12-11 DIAGNOSIS — Z7901 Long term (current) use of anticoagulants: Secondary | ICD-10-CM | POA: Insufficient documentation

## 2017-12-11 DIAGNOSIS — I10 Essential (primary) hypertension: Secondary | ICD-10-CM | POA: Insufficient documentation

## 2017-12-11 DIAGNOSIS — E785 Hyperlipidemia, unspecified: Secondary | ICD-10-CM | POA: Insufficient documentation

## 2017-12-11 DIAGNOSIS — Z85828 Personal history of other malignant neoplasm of skin: Secondary | ICD-10-CM | POA: Insufficient documentation

## 2017-12-11 DIAGNOSIS — K3189 Other diseases of stomach and duodenum: Secondary | ICD-10-CM | POA: Insufficient documentation

## 2017-12-11 DIAGNOSIS — Z8249 Family history of ischemic heart disease and other diseases of the circulatory system: Secondary | ICD-10-CM | POA: Insufficient documentation

## 2017-12-11 DIAGNOSIS — K5730 Diverticulosis of large intestine without perforation or abscess without bleeding: Secondary | ICD-10-CM | POA: Diagnosis not present

## 2017-12-11 DIAGNOSIS — E039 Hypothyroidism, unspecified: Secondary | ICD-10-CM | POA: Diagnosis not present

## 2017-12-11 DIAGNOSIS — Z888 Allergy status to other drugs, medicaments and biological substances status: Secondary | ICD-10-CM | POA: Diagnosis not present

## 2017-12-11 DIAGNOSIS — R112 Nausea with vomiting, unspecified: Secondary | ICD-10-CM

## 2017-12-11 DIAGNOSIS — K2990 Gastroduodenitis, unspecified, without bleeding: Secondary | ICD-10-CM

## 2017-12-11 DIAGNOSIS — R194 Change in bowel habit: Secondary | ICD-10-CM | POA: Diagnosis present

## 2017-12-11 DIAGNOSIS — R110 Nausea: Secondary | ICD-10-CM

## 2017-12-11 HISTORY — PX: ESOPHAGOGASTRODUODENOSCOPY (EGD) WITH PROPOFOL: SHX5813

## 2017-12-11 HISTORY — PX: COLONOSCOPY WITH PROPOFOL: SHX5780

## 2017-12-11 SURGERY — COLONOSCOPY WITH PROPOFOL
Anesthesia: General

## 2017-12-11 MED ORDER — PROPOFOL 500 MG/50ML IV EMUL
INTRAVENOUS | Status: DC | PRN
  Administered 2017-12-11: 140 ug/kg/min via INTRAVENOUS

## 2017-12-11 MED ORDER — LIDOCAINE HCL (PF) 2 % IJ SOLN
INTRAMUSCULAR | Status: AC
  Filled 2017-12-11: qty 10

## 2017-12-11 MED ORDER — LIDOCAINE HCL (PF) 1 % IJ SOLN
2.0000 mL | Freq: Once | INTRAMUSCULAR | Status: AC
  Administered 2017-12-11: 0.3 mL via INTRADERMAL

## 2017-12-11 MED ORDER — PROPOFOL 500 MG/50ML IV EMUL
INTRAVENOUS | Status: AC
  Filled 2017-12-11: qty 50

## 2017-12-11 MED ORDER — LIDOCAINE HCL (CARDIAC) 20 MG/ML IV SOLN
INTRAVENOUS | Status: DC | PRN
  Administered 2017-12-11: 50 mg via INTRAVENOUS

## 2017-12-11 MED ORDER — SODIUM CHLORIDE 0.9 % IV SOLN
INTRAVENOUS | Status: DC
  Administered 2017-12-11: 1000 mL via INTRAVENOUS

## 2017-12-11 MED ORDER — PROPOFOL 10 MG/ML IV BOLUS
INTRAVENOUS | Status: DC | PRN
  Administered 2017-12-11 (×2): 100 mg via INTRAVENOUS

## 2017-12-11 MED ORDER — LIDOCAINE HCL (PF) 1 % IJ SOLN
INTRAMUSCULAR | Status: AC
  Administered 2017-12-11: 0.3 mL via INTRADERMAL
  Filled 2017-12-11: qty 2

## 2017-12-11 NOTE — Op Note (Signed)
[facility] Gastroenterology Patient Name: Latoya Douglas Procedure Date: 12/11/2017 12:48 PM MRN: [id number] Account #: [id number] Date of Birth: 09/11/1940 Admit Type: Outpatient Age: 77 Room: [facility] ENDO ROOM 4 Gender: Female Note Status: Finalized Procedure:            Colonoscopy Indications:          Change in bowel habits Providers:            Lucilla Lame MD, MD Referring MD:         Casilda Carls, MD (Referring MD) Medicines:            Propofol per Anesthesia Complications:        No immediate complications. Procedure:            Pre-Anesthesia Assessment:                       - Prior to the procedure, a History and Physical was                        performed, and patient medications and allergies were                        reviewed. The patient's tolerance of previous                        anesthesia was also reviewed. The risks and benefits of                        the procedure and the sedation options and risks were                        discussed with the patient. All questions were                        answered, and informed consent was obtained. Prior                        Anticoagulants: The patient has taken no previous                        anticoagulant or antiplatelet agents. ASA Grade                        Assessment: II - A patient with mild systemic disease.                        After reviewing the risks and benefits, the patient was                        deemed in satisfactory condition to undergo the                        procedure.                       After obtaining informed consent, the colonoscope was                        passed under direct vision. Throughout the procedure,  the patient's blood pressure, pulse, and oxygen                        saturations were monitored continuously. The                        Colonoscope was introduced through the anus and                        advanced to  the the cecum, identified by appendiceal                        orifice and ileocecal valve. The colonoscopy was                        performed without difficulty. The patient tolerated the                        procedure well. The quality of the bowel preparation                        was excellent. Findings:      The perianal and digital rectal examinations were normal.      Multiple small-mouthed diverticula were found in the sigmoid colon.      Non-bleeding internal hemorrhoids were found during retroflexion. The       hemorrhoids were Grade I (internal hemorrhoids that do not prolapse).      Random biopsies were obtained with cold forceps for histology randomly       in the entire colon. Impression:           - Diverticulosis in the sigmoid colon.                       - Non-bleeding internal hemorrhoids.                       - Random biopsies were obtained in the entire colon. Recommendation:       - Discharge patient to home.                       - Resume previous diet.                       - Continue present medications.                       - Await pathology results. Procedure Code(s):    --- Professional ---                       [phone], Colonoscopy, flexible; with biopsy, single or                        multiple Diagnosis Code(s):    --- Professional ---                       R19.4, Change in bowel habit CPT copyright 2017 American Medical Association. All rights reserved. The codes documented in this report are preliminary and upon coder review may  be revised to meet current compliance requirements. Lucilla Lame MD, MD 12/11/2017 1:06:58 PM This report has been signed electronically. Number of  Addenda: 0 Note Initiated On: 12/11/2017 12:48 PM Scope Withdrawal Time: 0 hours 6 minutes 57 seconds  Total Procedure Duration: 0 hours 8 minutes 26 seconds       [facility]

## 2017-12-11 NOTE — Op Note (Signed)
[facility] Gastroenterology Patient Name: Latoya Douglas Procedure Date: 12/11/2017 12:49 PM MRN: [id number] Account #: [id number] Date of Birth: [birth year] Admit Type: Outpatient Age: 77 Room: [facility] ENDO ROOM 4 Gender: Female Note Status: Finalized Procedure:            Upper GI endoscopy Indications:          Nausea Providers:            Lucilla Lame MD, MD Referring MD:         Casilda Carls, MD (Referring MD) Medicines:            Propofol per Anesthesia Complications:        No immediate complications. Procedure:            Pre-Anesthesia Assessment:                       - Prior to the procedure, a History and Physical was                        performed, and patient medications and allergies were                        reviewed. The patient's tolerance of previous                        anesthesia was also reviewed. The risks and benefits of                        the procedure and the sedation options and risks were                        discussed with the patient. All questions were                        answered, and informed consent was obtained. Prior                        Anticoagulants: The patient has taken no previous                        anticoagulant or antiplatelet agents. ASA Grade                        Assessment: II - A patient with mild systemic disease.                        After reviewing the risks and benefits, the patient was                        deemed in satisfactory condition to undergo the                        procedure.                       After obtaining informed consent, the endoscope was                        passed under direct vision. Throughout the procedure,  the patient's blood pressure, pulse, and oxygen                        saturations were monitored continuously. The Endoscope                        was introduced through the mouth, and advanced to the                        second  part of duodenum. The upper GI endoscopy was                        accomplished without difficulty. The patient tolerated                        the procedure well. Findings:      A small hiatal hernia was present.      Localized mild inflammation characterized by erythema was found in the       gastric antrum. Biopsies were taken with a cold forceps for histology.      Localized mild inflammation characterized by erythema was found in the       duodenal bulb. Impression:           - Small hiatal hernia.                       - Gastritis. Biopsied.                       - Duodenitis. Recommendation:       - Discharge patient to home.                       - Resume previous diet.                       - Continue present medications.                       - Await pathology results.                       - Perform a colonoscopy today. Procedure Code(s):    --- Professional ---                       [phone], Esophagogastroduodenoscopy, flexible, transoral;                        with biopsy, single or multiple Diagnosis Code(s):    --- Professional ---                       R11.0, Nausea                       K29.70, Gastritis, unspecified, without bleeding                       K29.80, Duodenitis without bleeding CPT copyright 2017 American Medical Association. All rights reserved. The codes documented in this report are preliminary and upon coder review may  be revised to meet current compliance requirements. Lucilla Lame MD, MD 12/11/2017 12:56:38 PM This report has been signed electronically. Number of Addenda: 0 Note Initiated  On: 12/11/2017 12:49 PM      [facility]

## 2017-12-11 NOTE — Anesthesia Postprocedure Evaluation (Signed)
Anesthesia Post Note  Patient: TRINATY BUNDRICK  Procedure(s) Performed: COLONOSCOPY WITH PROPOFOL (N/A ) ESOPHAGOGASTRODUODENOSCOPY (EGD) WITH PROPOFOL (N/A )  Patient location during evaluation: Endoscopy Anesthesia Type: General Level of consciousness: awake and alert Pain management: pain level controlled Vital Signs Assessment: post-procedure vital signs reviewed and stable Respiratory status: spontaneous breathing and respiratory function stable Cardiovascular status: stable Anesthetic complications: no     Last Vitals:  Vitals:   12/11/17 1311 12/11/17 1312  BP: 110/62   Pulse:    Resp:    Temp:  (!) 36.2 C  SpO2:      Last Pain:  Vitals:   12/11/17 1311  TempSrc:   PainSc: 0-No pain                 Deanna Boehlke K

## 2017-12-11 NOTE — Transfer of Care (Signed)
Immediate Anesthesia Transfer of Care Note  Patient: Latoya Douglas  Procedure(s) Performed: COLONOSCOPY WITH PROPOFOL (N/A ) ESOPHAGOGASTRODUODENOSCOPY (EGD) WITH PROPOFOL (N/A )  Patient Location: PACU and Endoscopy Unit  Anesthesia Type:General  Level of Consciousness: drowsy  Airway & Oxygen Therapy: Patient Spontanous Breathing and Patient connected to nasal cannula oxygen  Post-op Assessment: Report given to RN and Post -op Vital signs reviewed and stable  Post vital signs: Reviewed and stable  Last Vitals:  Vitals Value Taken Time  BP    Temp    Pulse 54 12/11/2017  1:11 PM  Resp 12 12/11/2017  1:11 PM  SpO2 100 % 12/11/2017  1:11 PM  Vitals shown include unvalidated device data.  Last Pain:  Vitals:   12/11/17 1216  TempSrc: Tympanic  PainSc: 0-No pain         Complications: No apparent anesthesia complications

## 2017-12-11 NOTE — Anesthesia Preprocedure Evaluation (Addendum)
Anesthesia Evaluation  Patient identified by MRN, date of birth, ID band Patient awake    Reviewed: Allergy & Precautions, NPO status , Patient's Chart, lab work & pertinent test results  History of Anesthesia Complications (+) PONV and history of anesthetic complications  Airway Mallampati: II       Dental   Pulmonary neg sleep apnea, neg COPD,           Cardiovascular hypertension, Pt. on medications (-) Past MI and (-) CHF (-) dysrhythmias (-) Valvular Problems/Murmurs     Neuro/Psych neg Seizures    GI/Hepatic Neg liver ROS, GERD  Medicated,  Endo/Other  neg diabetesHypothyroidism   Renal/GU negative Renal ROS     Musculoskeletal   Abdominal   Peds  Hematology   Anesthesia Other Findings   Reproductive/Obstetrics                            Anesthesia Physical Anesthesia Plan  ASA: II  Anesthesia Plan: General   Post-op Pain Management:    Induction: Intravenous  PONV Risk Score and Plan: 3 and TIVA, Midazolam and Propofol infusion  Airway Management Planned: Nasal Cannula  Additional Equipment:   Intra-op Plan:   Post-operative Plan:   Informed Consent: I have reviewed the patients History and Physical, chart, labs and discussed the procedure including the risks, benefits and alternatives for the proposed anesthesia with the patient or authorized representative who has indicated his/her understanding and acceptance.     Plan Discussed with:   Anesthesia Plan Comments:         Anesthesia Quick Evaluation

## 2017-12-11 NOTE — Anesthesia Post-op Follow-up Note (Signed)
Anesthesia QCDR form completed.        

## 2017-12-11 NOTE — H&P (Signed)
Lucilla Lame, MD [address] [place], [place] [address] Phone:[phone] Fax : [phone]  Primary Care Physician:  Casilda Carls, MD Primary Gastroenterologist:  Dr. Allen Norris  Pre-Procedure History & Physical: HPI:  Latoya Douglas is a 77 y.o. female is here for an endoscopy and colonoscopy.   Past Medical History:  Diagnosis Date  . Arthritis   . Cancer ([place])    skin cancer on forehead/face  . Cough    post nasal drip  . DVT (deep venous thrombosis) ([place])    in arm (from IV)  "Years ago"  . Frequent UTI   . Hyperlipidemia   . Hypertension   . Hypothyroidism   . PONV (postoperative nausea and vomiting)     Past Surgical History:  Procedure Laterality Date  . ABDOMINAL HYSTERECTOMY    . APPENDECTOMY    . BREAST BIOPSY Right 10+ YRS AGO   NEG  . BREAST SURGERY     biopsy - benign  . CESAREAN SECTION     x2  . COLONOSCOPY    . DILATION AND CURETTAGE OF UTERUS    . EYE SURGERY Bilateral    cataract surgery with lens implant  . JOINT REPLACEMENT    . LUMBAR LAMINECTOMY/DECOMPRESSION MICRODISCECTOMY Right 10/15/2014   Procedure: Laminectomy and Foraminotomy - L2-L3 - right - L3-L4 - bilateral;  Surgeon: Faythe Ghee, MD;  Location: [facility];  Service: Neurosurgery;  Laterality: Right;  Laminectomy and Foraminotomy - L2-L3 - right - L3-L4 - bilateral  . TOTAL HIP ARTHROPLASTY Right 07/05/2015   Procedure: TOTAL HIP ARTHROPLASTY;  Surgeon: Dereck Leep, MD;  Location: [facility] ORS;  Service: Orthopedics;  Laterality: Right;    Prior to Admission medications   Medication Sig Start Date End Date Taking? Authorizing Provider  acetaminophen (TYLENOL) 650 MG CR tablet Take 650 mg by mouth daily after supper. 1 or 2    [provider]  atorvastatin (LIPITOR) 40 MG tablet Take 40 mg by mouth daily.     [provider]  calcium-vitamin D (CALCIUM 500/D) 500-200 MG-UNIT per tablet Take 1 tablet by mouth daily with breakfast.     [provider]  cetirizine (ZYRTEC) 10 MG tablet Take 10 mg by mouth daily.    [provider]  enoxaparin (LOVENOX) 40 MG/0.4ML injection Inject 0.4 mLs (40 mg total) into the skin daily. Patient not taking: Reported on 12/04/2017 07/07/15   Watt Climes, PA  Ginkgo Biloba (GINKOBA PO) Take 120 mg by mouth daily.    [provider]  levothyroxine (SYNTHROID, LEVOTHROID) 88 MCG tablet Take 75 mcg by mouth daily before breakfast.     [provider]  losartan-hydrochlorothiazide (HYZAAR) 100-25 MG per tablet Take 1 tablet by mouth daily.     [provider]  methylPREDNISolone (MEDROL) 4 MG TBPK tablet Dispense Medrol Dosepak as directed Patient not taking: Reported on 12/04/2017 10/26/16   Earleen Newport, MD  Multiple Vitamin (MULTI-VITAMINS) TABS Take 1 tablet by mouth daily.     [provider]  Omega-3 Fatty Acids (FISH OIL) 1000 MG CAPS Take 1,200 mg by mouth 2 (two) times daily.     [provider]  Propylene Glycol (SYSTANE BALANCE OP) Apply 1 drop to eye daily as needed. Both eyes    [provider]  traMADol (ULTRAM) 50 MG tablet Take 50 mg by mouth every 6 (six) hours as needed for moderate pain. Take one to two tablets every 4 to  6 hours    [provider]  traMADol (ULTRAM) 50 MG tablet Take 1-2 tablets (50-100 mg total) by mouth every 4 (four) hours as needed for moderate pain. Patient not taking: Reported on 12/04/2017 07/07/15   Watt Climes, PA  vitamin E 400 UNIT capsule Take 400 Units by mouth.     [provider]    Allergies as of 12/06/2017 - Review Complete 12/04/2017  Allergen Reaction Noted  . Tape Itching 10/07/2014    Family History  Problem Relation Age of Onset  . Heart attack Mother   . Cancer Father   . Heart attack Brother   . Breast cancer Neg Hx     Social History   Socioeconomic History  . Marital status: Married    Spouse name: Not on file  . Number of children: Not on  file  . Years of education: Not on file  . Highest education level: Not on file  Occupational History  . Not on file  Social Needs  . Financial resource strain: Not on file  . Food insecurity:    Worry: Not on file    Inability: Not on file  . Transportation needs:    Medical: Not on file    Non-medical: Not on file  Tobacco Use  . Smoking status: Never Smoker  . Smokeless tobacco: Never Used  Substance and Sexual Activity  . Alcohol use: No  . Drug use: No  . Sexual activity: Not on file  Lifestyle  . Physical activity:    Days per week: Not on file    Minutes per session: Not on file  . Stress: Not on file  Relationships  . Social connections:    Talks on phone: Not on file    Gets together: Not on file    Attends religious service: Not on file    Active member of club or organization: Not on file    Attends meetings of clubs or organizations: Not on file    Relationship status: Not on file  . Intimate partner violence:    Fear of current or ex partner: Not on file    Emotionally abused: Not on file    Physically abused: Not on file    Forced sexual activity: Not on file  Other Topics Concern  . Not on file  Social History Narrative  . Not on file    Review of Systems: See HPI, otherwise negative ROS  Physical Exam: BP (!) 167/71   Pulse 61   Temp (!) 96.2 F (35.7 C) (Tympanic)   Resp 17   Ht 5\' 6"  (1.676 m)   Wt 153 lb (69.4 kg)   SpO2 100%   BMI 24.69 kg/m  General:   Alert,  pleasant and cooperative in NAD Head:  Normocephalic and atraumatic. Neck:  Supple; no masses or thyromegaly. Lungs:  Clear throughout to auscultation.    Heart:  Regular rate and rhythm. Abdomen:  Soft, nontender and nondistended. Normal bowel sounds, without guarding, and without rebound.   Neurologic:  Alert and  oriented x4;  grossly normal neurologically.  Impression/Plan: Latoya Douglas is here for an endoscopy and colonoscopy to be performed for nausea and change in  bowel habits  Risks, benefits, limitations, and alternatives regarding  endoscopy and colonoscopy have been reviewed with the patient.  Questions have been answered.  All parties agreeable.   Lucilla Lame, MD  12/11/2017, 12:39 PM

## 2017-12-12 ENCOUNTER — Encounter: Payer: Self-pay | Admitting: Gastroenterology

## 2017-12-13 LAB — SURGICAL PATHOLOGY

## 2017-12-14 ENCOUNTER — Encounter: Payer: Self-pay | Admitting: Gastroenterology

## 2020-05-04 ENCOUNTER — Other Ambulatory Visit: Payer: Self-pay | Admitting: Internal Medicine

## 2020-05-04 DIAGNOSIS — Z1231 Encounter for screening mammogram for malignant neoplasm of breast: Secondary | ICD-10-CM

## 2020-06-08 ENCOUNTER — Ambulatory Visit
Admission: RE | Admit: 2020-06-08 | Discharge: 2020-06-08 | Disposition: A | Discharge status: Home / Self Care | Payer: Medicare PPO | Source: Ambulatory Visit | Attending: Internal Medicine | Admitting: Internal Medicine

## 2020-06-08 ENCOUNTER — Other Ambulatory Visit: Payer: Self-pay

## 2020-06-08 DIAGNOSIS — Z1231 Encounter for screening mammogram for malignant neoplasm of breast: Secondary | ICD-10-CM

## 2020-12-28 ENCOUNTER — Ambulatory Visit: Payer: Medicare PPO | Admitting: Podiatry

## 2020-12-28 ENCOUNTER — Encounter: Payer: Self-pay | Admitting: Podiatry

## 2020-12-28 ENCOUNTER — Other Ambulatory Visit: Payer: Self-pay

## 2020-12-28 ENCOUNTER — Ambulatory Visit (INDEPENDENT_AMBULATORY_CARE_PROVIDER_SITE_OTHER): Payer: Medicare PPO

## 2020-12-28 DIAGNOSIS — M2041 Other hammer toe(s) (acquired), right foot: Secondary | ICD-10-CM | POA: Diagnosis not present

## 2020-12-28 DIAGNOSIS — M2042 Other hammer toe(s) (acquired), left foot: Secondary | ICD-10-CM

## 2020-12-28 DIAGNOSIS — M2010 Hallux valgus (acquired), unspecified foot: Secondary | ICD-10-CM

## 2020-12-29 ENCOUNTER — Encounter: Payer: Self-pay | Admitting: Podiatry

## 2020-12-29 NOTE — Progress Notes (Signed)
Subjective:  Patient ID: Latoya Douglas, female    DOB: Dec 17, 1940,  MRN: [id number]  Chief Complaint  Patient presents with  . Bunions  . Hammer Toe    Patient presents today for bunions and hammertoes bilat and calluses bottom of right foot    80 y.o. female presents with the above complaint.  Patient presents with complaint of bilateral hammertoe and first MPJ arthritis.  Patient states is painful to walk on it she started get a callus because of it.  She states this has been going on for quite some time has progressive gotten worse.  She is try some shoe gear modification and protecting it padding and none of which has helped.  She is thinking about surgery however she does not know if she is ready for surgery yet.  She denies any other acute complaints she has not seen anyone else prior to seeing me.   Review of Systems: Negative except as noted in the HPI. Denies N/V/F/Ch.  Past Medical History:  Diagnosis Date  . Arthritis   . Cancer ([place])    skin cancer on forehead/face  . Cough    post nasal drip  . DVT (deep venous thrombosis) ([place])    in arm (from IV)  "Years ago"  . Frequent UTI   . Hyperlipidemia   . Hypertension   . Hypothyroidism   . PONV (postoperative nausea and vomiting)     Current Outpatient Medications:  .  acetaminophen (TYLENOL) 650 MG CR tablet, Take 650 mg by mouth daily after supper. 1 or 2, Disp: , Rfl:  .  aspirin 81 MG EC tablet, Take by mouth., Disp: , Rfl:  .  atorvastatin (LIPITOR) 40 MG tablet, Take 40 mg by mouth daily. , Disp: , Rfl:  .  calcium-vitamin D (CALCIUM 500/D) 500-200 MG-UNIT per tablet, Take 1 tablet by mouth daily with breakfast. , Disp: , Rfl:  .  cetirizine (ZYRTEC) 10 MG tablet, Take 10 mg by mouth daily., Disp: , Rfl:  .  clotrimazole-betamethasone (LOTRISONE) cream, Apply topically 2 (two) times daily., Disp: , Rfl:  .  Ginkgo Biloba (GINKOBA PO), Take 120 mg by mouth daily., Disp: , Rfl:  .  hydrochlorothiazide  (HYDRODIURIL) 25 MG tablet, Take 1 tablet by mouth daily., Disp: , Rfl:  .  levothyroxine (SYNTHROID, LEVOTHROID) 88 MCG tablet, Take 75 mcg by mouth daily before breakfast. , Disp: , Rfl:  .  losartan (COZAAR) 100 MG tablet, Take 1 tablet by mouth daily., Disp: , Rfl:  .  losartan-hydrochlorothiazide (HYZAAR) 100-25 MG per tablet, Take 1 tablet by mouth daily. , Disp: , Rfl:  .  Multiple Vitamin (MULTI-VITAMINS) TABS, Take 1 tablet by mouth daily. , Disp: , Rfl:  .  Omega-3 Fatty Acids (FISH OIL) 1000 MG CAPS, Take 1,200 mg by mouth 2 (two) times daily. , Disp: , Rfl:  .  Propylene Glycol (SYSTANE BALANCE OP), Apply 1 drop to eye daily as needed. Both eyes, Disp: , Rfl:  .  vitamin E 400 UNIT capsule, Take 400 Units by mouth. , Disp: , Rfl:   Social History   Tobacco Use  Smoking Status Never Smoker  Smokeless Tobacco Never Used    Allergies  Allergen Reactions  . Tape Itching    Red and irritated   Objective:  There were no vitals filed for this visit. There is no height or weight on file to calculate BMI. Constitutional Well developed. Well nourished.  Vascular Dorsalis pedis pulses palpable bilaterally. Posterior  tibial pulses palpable bilaterally. Capillary refill normal to all digits.  No cyanosis or clubbing noted. Pedal hair growth normal.  Neurologic Normal speech. Oriented to person, place, and time. Epicritic sensation to light touch grossly present bilaterally.  Dermatologic Nails well groomed and normal in appearance. No open wounds. No skin lesions.  Orthopedic:  Hammertoe contractures noted of bilateral second digit with crossover and improved dislocation syndrome.  Pain on palpation to the hammertoes.  This benign skin lesion/callus is noted to submit to especially on the right side.  Prominent first metatarsophalangeal joint arthritis noted.  Pain with range of motion of the first MPJ.  Deep intra-articular pain noted.   Radiographs: 3 views of skeletally  mature adult bilateral footSevere osteoarthritic changes noted of both first metatarsophalangeal joint with crossover deformity of the second third and fourth digit to the left side.  Hammertoe contractures of the second third and fourth noted as well up to the right side.  No other bony abnormalities identified  Assessment:   1. Valgus deformity of great toe, unspecified laterality   2. Hammertoes of both feet    Plan:  Patient was evaluated and treated and all questions answered.  Bilateral second digit hammertoe and bilateral first metatarsophalangeal joint arthritis -I explained the patient the etiology of hammertoes and arthritis and various treatment options were extensively discussed.  Given the amount of pain she is having I believe she will benefit from a steroid injection however patient would like to hold off on any type of injections.  She has tried all conservative treatment options including shoe gear modification padding protecting none of which has helped.  She is interested in surgery however she would like to think about all the surgical options that I discussed with her today.  And she will reach out to me at a later time to discuss surgery and schedule it.   No follow-ups on file.

## 2021-05-12 ENCOUNTER — Other Ambulatory Visit: Payer: Self-pay | Admitting: Internal Medicine

## 2021-05-12 ENCOUNTER — Other Ambulatory Visit: Payer: Self-pay | Admitting: Family Medicine

## 2021-05-12 DIAGNOSIS — Z1231 Encounter for screening mammogram for malignant neoplasm of breast: Secondary | ICD-10-CM

## 2021-06-09 ENCOUNTER — Other Ambulatory Visit: Payer: Self-pay

## 2021-06-09 ENCOUNTER — Ambulatory Visit
Admission: RE | Admit: 2021-06-09 | Discharge: 2021-06-09 | Disposition: A | Discharge status: Home / Self Care | Payer: Medicare PPO | Source: Ambulatory Visit | Attending: Internal Medicine | Admitting: Internal Medicine

## 2021-06-09 DIAGNOSIS — Z1231 Encounter for screening mammogram for malignant neoplasm of breast: Secondary | ICD-10-CM | POA: Diagnosis present

## 2021-11-17 ENCOUNTER — Other Ambulatory Visit (HOSPITAL_COMMUNITY): Payer: Self-pay | Admitting: Internal Medicine

## 2021-11-17 ENCOUNTER — Other Ambulatory Visit: Payer: Self-pay | Admitting: Internal Medicine

## 2021-11-17 DIAGNOSIS — R42 Dizziness and giddiness: Secondary | ICD-10-CM

## 2021-11-18 ENCOUNTER — Other Ambulatory Visit: Payer: Self-pay

## 2021-11-18 ENCOUNTER — Ambulatory Visit
Admission: RE | Admit: 2021-11-18 | Discharge: 2021-11-18 | Disposition: A | Discharge status: Home / Self Care | Payer: Medicare PPO | Source: Ambulatory Visit | Attending: Internal Medicine | Admitting: Internal Medicine

## 2021-11-18 DIAGNOSIS — R42 Dizziness and giddiness: Secondary | ICD-10-CM | POA: Insufficient documentation

## 2023-01-12 DIAGNOSIS — N390 Urinary tract infection, site not specified: Secondary | ICD-10-CM

## 2023-01-12 HISTORY — DX: Urinary tract infection, site not specified: N39.0

## 2023-01-14 NOTE — Discharge Instructions (Addendum)
Instructions after Total Hip Replacement   James P. Angie Fava., M.D.    Dept. of Orthopaedics & Sports Medicine [facility] [address] [place], [place]  [address]  Phone: [phone]   Fax: [phone]        [email or web address]        DIET: Drink plenty of non-alcoholic fluids. Resume your normal diet. Include foods high in fiber.  ACTIVITY:  You may use crutches or a walker with weight-bearing as tolerated, unless instructed otherwise. You may be weaned off of the walker or crutches by your Physical Therapist.  Do NOT reach below the level of your knees or cross your legs until allowed.    Continue doing gentle exercises. Exercising will reduce the pain and swelling, increase motion, and prevent muscle weakness.   Please continue to use the TED compression stockings for 6 weeks. You may remove the stockings at night, but should reapply them in the morning. Do not drive or operate any equipment until instructed.  WOUND CARE:  Continue to use ice packs periodically to reduce pain and swelling. Keep the incision clean and dry. You may bathe or shower after the staples are removed at the first office visit following surgery. Aquacel bandage stays on for 7 days post-operatively. This will be removed and changed to a honeycomb dressing by PT after this time.  MEDICATIONS: You may resume your regular medications. Please take the pain medication as prescribed on the medication. Do not take pain medication on an empty stomach. You have been given a prescription for a blood thinner to prevent blood clots. Please take the medication as instructed - Aspirin 81 mg twice daily. Continue to wear TED hose stockings on both legs. (NOTE: Pain medications and iron supplements can cause constipation. Use a stool softener (Senokot or Colace) on a daily basis and a laxative (dulcolax or miralax) as needed. Do not drive or drink alcoholic beverages when taking pain  medications.  CALL THE OFFICE FOR: Temperature above 101 degrees Excessive bleeding or drainage on the dressing. Excessive swelling, coldness, or paleness of the toes. Persistent nausea and vomiting.  FOLLOW-UP:  You should have an appointment to return to the office in 6 weeks after surgery. Arrangements have been made for continuation of Physical Therapy (either home therapy or outpatient therapy).      [facility] Department Directory         [email or web address]       [email or web address]          Cardiology  Appointments: [place] - [phone]  Endocrinology  Appointments: [place] [phone] [place] - [phone]  Gastroenterology  Appointments: [place] [phone] [place] - [phone]        General Surgery   Appointments: [place]  Internal Medicine/Family Medicine  Appointments: [facility] [place] - [phone] [place] - [phone]  Metabolic and Weigh Loss Surgery  Appointments: [facility]        Neurology  Appointments: [place] [phone] [place] - [phone]  Neurosurgery  Appointments: [place]  Obstetrics & Gynecology  Appointments: [place] [phone] [place] - [phone]        Pediatrics  Appointments: [place] [phone] [place] - [phone]  Physiatry  Appointments: [place] [phone]  Physical Therapy  Appointments: [place] - [phone]        Podiatry  Appointments: [place] [phone] [place] - [phone]  Pulmonology  Appointments: [place]  Rheumatology  Appointments: [place] [phone]  [place] Location: [facility]  [address] [place], [place]  [address]  [place] Location: [facility]. [address] [place], [place]  [address]  [place] Location: [facility] [address] [place], [place]  [address]

## 2023-01-18 ENCOUNTER — Encounter
Admission: RE | Admit: 2023-01-18 | Discharge: 2023-01-18 | Disposition: A | Discharge status: Home / Self Care | Payer: Medicare PPO | Source: Ambulatory Visit | Attending: Orthopedic Surgery | Admitting: Orthopedic Surgery

## 2023-01-18 VITALS — BP 120/56 | HR 63 | Resp 12 | Ht 65.0 in | Wt 143.3 lb

## 2023-01-18 DIAGNOSIS — K2990 Gastroduodenitis, unspecified, without bleeding: Secondary | ICD-10-CM | POA: Insufficient documentation

## 2023-01-18 DIAGNOSIS — M1612 Unilateral primary osteoarthritis, left hip: Secondary | ICD-10-CM

## 2023-01-18 DIAGNOSIS — R194 Change in bowel habit: Secondary | ICD-10-CM | POA: Insufficient documentation

## 2023-01-18 DIAGNOSIS — Z01818 Encounter for other preprocedural examination: Secondary | ICD-10-CM | POA: Insufficient documentation

## 2023-01-18 DIAGNOSIS — K2970 Gastritis, unspecified, without bleeding: Secondary | ICD-10-CM | POA: Diagnosis not present

## 2023-01-18 HISTORY — DX: Pneumonia, unspecified organism: J18.9

## 2023-01-18 HISTORY — DX: Personal history of other diseases of the digestive system: Z87.19

## 2023-01-18 HISTORY — DX: Gastro-esophageal reflux disease without esophagitis: K21.9

## 2023-01-18 HISTORY — DX: Prediabetes: R73.03

## 2023-01-18 HISTORY — DX: Spinal stenosis, lumbar region without neurogenic claudication: M48.061

## 2023-01-18 LAB — CBC WITH DIFFERENTIAL/PLATELET
Abs Immature Granulocytes: 0.03 10*3/uL (ref 0.00–0.07)
Basophils Absolute: 0.2 10*3/uL — ABNORMAL HIGH (ref 0.0–0.1)
Basophils Relative: 2 %
Eosinophils Absolute: 0.5 10*3/uL (ref 0.0–0.5)
Eosinophils Relative: 4 %
HCT: 40.8 % (ref 36.0–46.0)
Hemoglobin: 13.8 g/dL (ref 12.0–15.0)
Immature Granulocytes: 0 %
Lymphocytes Relative: 20 %
Lymphs Abs: 2.3 10*3/uL (ref 0.7–4.0)
MCH: 33 pg (ref 26.0–34.0)
MCHC: 33.8 g/dL (ref 30.0–36.0)
MCV: 97.6 fL (ref 80.0–100.0)
Monocytes Absolute: 0.9 10*3/uL (ref 0.1–1.0)
Monocytes Relative: 8 %
Neutro Abs: 7.3 10*3/uL (ref 1.7–7.7)
Neutrophils Relative %: 66 %
Platelets: 349 10*3/uL (ref 150–400)
RBC: 4.18 MIL/uL (ref 3.87–5.11)
RDW: 11.9 % (ref 11.5–15.5)
WBC: 11.1 10*3/uL — ABNORMAL HIGH (ref 4.0–10.5)
nRBC: 0 % (ref 0.0–0.2)

## 2023-01-18 LAB — TYPE AND SCREEN
ABO/RH(D): A POS
Antibody Screen: NEGATIVE

## 2023-01-18 LAB — URINALYSIS, ROUTINE W REFLEX MICROSCOPIC
Bilirubin Urine: NEGATIVE
Glucose, UA: NEGATIVE mg/dL
Hgb urine dipstick: NEGATIVE
Ketones, ur: NEGATIVE mg/dL
Nitrite: NEGATIVE
Protein, ur: NEGATIVE mg/dL
Specific Gravity, Urine: 1.016 (ref 1.005–1.030)
pH: 7 (ref 5.0–8.0)

## 2023-01-18 LAB — COMPREHENSIVE METABOLIC PANEL
ALT: 22 U/L (ref 0–44)
AST: 26 U/L (ref 15–41)
Albumin: 4.4 g/dL (ref 3.5–5.0)
Alkaline Phosphatase: 54 U/L (ref 38–126)
Anion gap: 10 (ref 5–15)
BUN: 33 mg/dL — ABNORMAL HIGH (ref 8–23)
CO2: 27 mmol/L (ref 22–32)
Calcium: 10.2 mg/dL (ref 8.9–10.3)
Chloride: 101 mmol/L (ref 98–111)
Creatinine, Ser: 0.73 mg/dL (ref 0.44–1.00)
GFR, Estimated: >60 mL/min (ref >60)
Glucose, Bld: 81 mg/dL (ref 70–99)
Potassium: 3.9 mmol/L (ref 3.5–5.1)
Sodium: 138 mmol/L (ref 135–145)
Total Bilirubin: 0.7 mg/dL (ref 0.3–1.2)
Total Protein: 8.3 g/dL — ABNORMAL HIGH (ref 6.5–8.1)

## 2023-01-18 LAB — SURGICAL PCR SCREEN
MRSA, PCR: NEGATIVE
Staphylococcus aureus: NEGATIVE

## 2023-01-18 LAB — SEDIMENTATION RATE: Sed Rate: 27 mm/hr (ref 0–30)

## 2023-01-18 LAB — C-REACTIVE PROTEIN: CRP: 0.7 mg/dL (ref <1.0)

## 2023-01-18 NOTE — Patient Instructions (Addendum)
Your procedure is scheduled on:01-29-23 Monday Report to the Registration Desk on the 1st floor of the [facility].Then proceed to the 2nd floor Surgery Desk To find out your arrival time, please call [phone] between 1PM - 3PM on:01-26-23 Friday If your arrival time is 6:00 am, do not arrive before that time as the [facility] entrance doors do not open until 6:00 am.  REMEMBER: Instructions that are not followed completely may result in serious medical risk, up to and including death; or upon the discretion of your surgeon and anesthesiologist your surgery may need to be rescheduled.  Do not eat food after midnight the night before surgery.  No gum chewing or hard candies.  You may however, drink CLEAR liquids up to 2 hours before you are scheduled to arrive for your surgery. Do not drink anything within 2 hours of your scheduled arrival time.  Clear liquids include: - water  - apple juice without pulp - gatorade (not RED colors) - black coffee or tea (Do NOT add milk or creamers to the coffee or tea) Do NOT drink anything that is not on this list.  In addition, your doctor has ordered for you to drink the provided:  Ensure Pre-Surgery Clear Carbohydrate Drink  Drinking this carbohydrate drink up to two hours before surgery helps to reduce insulin resistance and improve patient outcomes. Please complete drinking 2 hours before scheduled arrival time.  One week prior to surgery:Stop taking on 01-21-23 Sunday Stop Anti-inflammatories (NSAIDS) such as Advil, Aleve, Ibuprofen, Motrin, Naproxen, Naprosyn and Aspirin based products such as Excedrin, Goody's Powder, BC Powder.You may however, take Tylenol if needed for pain up until the day of surgery. You may continue your celecoxib (CELEBREX) up until the day prior to surgery-Do NOT take the morning of surgery Stop ANY OVER THE COUNTER supplements/vitamins 7 days prior to surgery (Ginkgo Biloba, Multivitamin, Calcium+D)  TAKE ONLY THESE  MEDICATIONS THE MORNING OF SURGERY WITH A SIP OF WATER: -amLODipine (NORVASC)  -levothyroxine (SYNTHROID)   Continue your losartan (COZAAR) and hydrochlorothiazide (HYDRODIURIL) up until the day prior to surgery-Do NOT take the morning of your surgery  No Alcohol for 24 hours before or after surgery.  No Smoking including e-cigarettes for 24 hours before surgery.  No chewable tobacco products for at least 6 hours before surgery.  No nicotine patches on the day of surgery.  Do not use any "recreational" drugs for at least a week (preferably 2 weeks) before your surgery.  Please be advised that the combination of cocaine and anesthesia may have negative outcomes, up to and including death. If you test positive for cocaine, your surgery will be cancelled.  On the morning of surgery brush your teeth with toothpaste and water, you may rinse your mouth with mouthwash if you wish. Do not swallow any toothpaste or mouthwash.  Use CHG Soap as directed on instruction sheet.  Do not wear jewelry, make-up, hairpins, clips or nail polish.  Do not wear lotions, powders, or perfumes.   Do not shave body hair from the neck down 48 hours before surgery.  Contact lenses, hearing aids and dentures may not be worn into surgery.  Do not bring valuables to the hospital. [facility] is not responsible for any missing/lost belongings or valuables.   Notify your doctor if there is any change in your medical condition (cold, fever, infection).  Wear comfortable clothing (specific to your surgery type) to the hospital.  After surgery, you can help prevent lung complications by  doing breathing exercises.  Take deep breaths and cough every 1-2 hours. Your doctor may order a device called an Incentive Spirometer to help you take deep breaths. When coughing or sneezing, hold a pillow firmly against your incision with both hands. This is called "splinting." Doing this helps protect your incision. It also  decreases belly discomfort.  If you are being admitted to the hospital overnight, leave your suitcase in the car. After surgery it may be brought to your room.  In case of increased patient census, it may be necessary for you, the patient, to continue your postoperative care in the Same Day Surgery department.  If you are being discharged the day of surgery, you will not be allowed to drive home. You will need a responsible individual to drive you home and stay with you for 24 hours after surgery.   If you are taking public transportation, you will need to have a responsible individual with you.  Please call the Pre-admissions Testing Dept. at [phone] if you have any questions about these instructions.  Surgery Visitation Policy:  Patients having surgery or a procedure may have two visitors.  Children under the age of 31 must have an adult with them who is not the patient.  Inpatient Visitation:    Visiting hours are 7 a.m. to 8 p.m. Up to four visitors are allowed at one time in a patient room. The visitors may rotate out with other people during the day.  One visitor age 66 or older may stay with the patient overnight and must be in the room by 8 p.m.    Pre-operative 5 CHG Bath Instructions   You can play a key role in reducing the risk of infection after surgery. Your skin needs to be as free of germs as possible. You can reduce the number of germs on your skin by washing with CHG (chlorhexidine gluconate) soap before surgery. CHG is an antiseptic soap that kills germs and continues to kill germs even after washing.   DO NOT use if you have an allergy to chlorhexidine/CHG or antibacterial soaps. If your skin becomes reddened or irritated, stop using the CHG and notify one of our RNs at [phone].   Please shower with the CHG soap starting 4 days before surgery using the following schedule:     Please keep in mind the following:  DO NOT shave, including legs and  underarms, starting the day of your first shower.   You may shave your face at any point before/day of surgery.  Place clean sheets on your bed the day you start using CHG soap. Use a clean washcloth (not used since being washed) for each shower. DO NOT sleep with pets once you start using the CHG.   CHG Shower Instructions:  If you choose to wash your hair and private area, wash first with your normal shampoo/soap.  After you use shampoo/soap, rinse your hair and body thoroughly to remove shampoo/soap residue.  Turn the water OFF and apply about 3 tablespoons (45 ml) of CHG soap to a CLEAN washcloth.  Apply CHG soap ONLY FROM YOUR NECK DOWN TO YOUR TOES (washing for 3-5 minutes)  DO NOT use CHG soap on face, private areas, open wounds, or sores.  Pay special attention to the area where your surgery is being performed.  If you are having back surgery, having someone wash your back for you may be helpful. Wait 2 minutes after CHG soap is applied, then you may rinse  off the CHG soap.  Pat dry with a clean towel  Put on clean clothes/pajamas   If you choose to wear lotion, please use ONLY the CHG-compatible lotions on the back of this paper.     Additional instructions for the day of surgery: DO NOT APPLY any lotions, deodorants, cologne, or perfumes.   Put on clean/comfortable clothes.  Brush your teeth.  Ask your nurse before applying any prescription medications to the skin.      CHG Compatible Lotions   Aveeno Moisturizing lotion  Cetaphil Moisturizing Cream  Cetaphil Moisturizing Lotion  Clairol Herbal Essence Moisturizing Lotion, Dry Skin  Clairol Herbal Essence Moisturizing Lotion, Extra Dry Skin  Clairol Herbal Essence Moisturizing Lotion, Normal Skin  Curel Age Defying Therapeutic Moisturizing Lotion with Alpha Hydroxy  Curel Extreme Care Body Lotion  Curel Soothing Hands Moisturizing Hand Lotion  Curel Therapeutic Moisturizing Cream, Fragrance-Free  Curel Therapeutic  Moisturizing Lotion, Fragrance-Free  Curel Therapeutic Moisturizing Lotion, Original Formula  Eucerin Daily Replenishing Lotion  Eucerin Dry Skin Therapy Plus Alpha Hydroxy Crme  Eucerin Dry Skin Therapy Plus Alpha Hydroxy Lotion  Eucerin Original Crme  Eucerin Original Lotion  Eucerin Plus Crme Eucerin Plus Lotion  Eucerin TriLipid Replenishing Lotion  Keri Anti-Bacterial Hand Lotion  Keri Deep Conditioning Original Lotion Dry Skin Formula Softly Scented  Keri Deep Conditioning Original Lotion, Fragrance Free Sensitive Skin Formula  Keri Lotion Fast Absorbing Fragrance Free Sensitive Skin Formula  Keri Lotion Fast Absorbing Softly Scented Dry Skin Formula  Keri Original Lotion  Keri Skin Renewal Lotion Keri Silky Smooth Lotion  Keri Silky Smooth Sensitive Skin Lotion  Nivea Body Creamy Conditioning Oil  Nivea Body Extra Enriched Lotion  Nivea Body Original Lotion  Nivea Body Sheer Moisturizing Lotion Nivea Crme  Nivea Skin Firming Lotion  NutraDerm 30 Skin Lotion  NutraDerm Skin Lotion  NutraDerm Therapeutic Skin Cream  NutraDerm Therapeutic Skin Lotion  ProShield Protective Hand Cream  Provon moisturizing lotion  How to Use an Incentive Spirometer An incentive spirometer is a tool that measures how well you are filling your lungs with each breath. Learning to take long, deep breaths using this tool can help you keep your lungs clear and active. This may help to reverse or lessen your chance of developing breathing (pulmonary) problems, especially infection. You may be asked to use a spirometer: After a surgery. If you have a lung problem or a history of smoking. After a long period of time when you have been unable to move or be active. If the spirometer includes an indicator to show the highest number that you have reached, your health care provider or respiratory therapist will help you set a goal. Keep a log of your progress as told by your health care provider. What are  the risks? Breathing too quickly may cause dizziness or cause you to pass out. Take your time so you do not get dizzy or light-headed. If you are in pain, you may need to take pain medicine before doing incentive spirometry. It is harder to take a deep breath if you are having pain. How to use your incentive spirometer  Sit up on the edge of your bed or on a chair. Hold the incentive spirometer so that it is in an upright position. Before you use the spirometer, breathe out normally. Place the mouthpiece in your mouth. Make sure your lips are closed tightly around it. Breathe in slowly and as deeply as you can through your mouth, causing the piston or  the ball to rise toward the top of the chamber. Hold your breath for 3-5 seconds, or for as long as possible. If the spirometer includes a coach indicator, use this to guide you in breathing. Slow down your breathing if the indicator goes above the marked areas. Remove the mouthpiece from your mouth and breathe out normally. The piston or ball will return to the bottom of the chamber. Rest for a few seconds, then repeat the steps 10 or more times. Take your time and take a few normal breaths between deep breaths so that you do not get dizzy or light-headed. Do this every 1-2 hours when you are awake. If the spirometer includes a goal marker to show the highest number you have reached (best effort), use this as a goal to work toward during each repetition. After each set of 10 deep breaths, cough a few times. This will help to make sure that your lungs are clear. If you have an incision on your chest or abdomen from surgery, place a pillow or a rolled-up towel firmly against the incision when you cough. This can help to reduce pain while taking deep breaths and coughing. General tips When you are able to get out of bed: Walk around often. Continue to take deep breaths and cough in order to clear your lungs. Keep using the incentive spirometer  until your health care provider says it is okay to stop using it. If you have been in the hospital, you may be told to keep using the spirometer at home. Contact a health care provider if: You are having difficulty using the spirometer. You have trouble using the spirometer as often as instructed. Your pain medicine is not giving enough relief for you to use the spirometer as told. You have a fever. Get help right away if: You develop shortness of breath. You develop a cough with bloody mucus from the lungs. You have fluid or blood coming from an incision site after you cough. Summary An incentive spirometer is a tool that can help you learn to take long, deep breaths to keep your lungs clear and active. You may be asked to use a spirometer after a surgery, if you have a lung problem or a history of smoking, or if you have been inactive for a long period of time. Use your incentive spirometer as instructed every 1-2 hours while you are awake. If you have an incision on your chest or abdomen, place a pillow or a rolled-up towel firmly against your incision when you cough. This will help to reduce pain. Get help right away if you have shortness of breath, you cough up bloody mucus, or blood comes from your incision when you cough. This information is not intended to replace advice given to you by your health care provider. Make sure you discuss any questions you have with your health care provider. Document Revised: 11/03/2019 Document Reviewed: 11/03/2019 Elsevier Patient Education  2023 [employer].

## 2023-01-28 NOTE — H&P (Signed)
ORTHOPAEDIC HISTORY & PHYSICAL Raenette Rover, [place] - 01/23/2023 1:45 PM EDT Formatting of this note is different from the original. NAME: Latoya Douglas H&P Date: 01/23/2023 Procedure Date: 01/29/2023  Chief Complaint: left hip pain  HPI Latoya Douglas is a 82 y.o. female who has severe left hip pain and has failed conservative treatment including NSAID's and activity modification. She has requested operative intervention for relief of her pain and symptoms. She states that her pain is an 8/10 today.. She has not had any recent trauma or aggravating event, and states that the pain has been persistent for the last year and a half. Has had worse range of motion and is limiting her ability to go about her ADLs or ambulate long distances. Patient states that she has tried also taking Celebrex in the past which has given her an upset stomach.  Social Hx: Patient does not currently work, is a non-smoker and does not use illicit drugs. She is a prediabetic  Hx of clots: Patient has no known history of any clots for herself or within her family. Does state that she does have an extensive family history of cardiac issues, not currently seeing a cardiologist  Medications & Allergies Allergies: Allergies Allergen Reactions Adhesive Itching Red and irritated  Home Medicines: Current Outpatient Medications on File Prior to Visit Medication Sig Dispense Refill acetaminophen (TYLENOL) 650 MG ER tablet Take 1,300 mg by mouth every other day amLODIPine (NORVASC) 2.5 MG tablet Take 2.5 mg by mouth 2 (two) times daily atorvastatin (LIPITOR) 40 MG tablet Take 40 mg by mouth once daily. calcium carbonate-vitamin D3 (OS-CAL 500+D) 500 mg(1,250mg ) -200 unit tablet Take 1 tablet by mouth 2 (two) times daily with meals. Reported on 09/13/2015  celecoxib (CELEBREX) 100 MG capsule Take 1 capsule (100 mg total) by mouth 2 (two) times daily 30 capsule 0 ginkgo biloba leaf extract 60 mg Cap Take by  mouth hydroCHLOROthiazide (HYDRODIURIL) 25 MG tablet Take 1 tablet by mouth once daily levothyroxine (SYNTHROID, LEVOTHROID) 75 MCG tablet Take 75 mcg by mouth once daily menthol (BIOFREEZE, MENTHOL,) 4 % Gel Apply topically multivitamin tablet Take 1 tablet by mouth once daily. Reported on 09/30/2015  neomycin-polymyxin-dexAMETHasone (MAXITROL) ophthalmic suspension Place 1 drop into both eyes 3 (three) times daily olopatadine (PATADAY) 0.2 % ophthalmic solution Apply 1 drop to eye as needed propylene glycol (SYSTANE BALANCE) 0.6 % ophthalmic drops Apply to eye. Reported on 09/13/2015  celecoxib (CELEBREX) 100 MG capsule Take 1 capsule (100 mg total) by mouth 2 (two) times daily for 30 days (Patient not taking: Reported on 01/23/2023) 60 capsule 0 celecoxib (CELEBREX) 200 MG capsule Take 1 capsule (200 mg total) by mouth 2 (two) times daily (Patient not taking: Reported on 01/23/2023) 60 capsule 3 losartan (COZAAR) 100 MG tablet (Patient not taking: Reported on 01/23/2023) olopatadine (PATANOL) 0.1 % ophthalmic solution Place 1 drop into both eyes once daily (Patient not taking: Reported on 01/23/2023) omega-3 fatty acids/fish oil 340-1,000 mg capsule Take by mouth. Reported on 09/30/2015 (Patient not taking: Reported on 01/23/2023) pediatric multivitamin with iron chewable tablet Take by mouth (Patient not taking: Reported on 01/23/2023) vitamin E 400 UNIT capsule Take 400 Units by mouth once daily. Reported on 09/30/2015 (Patient not taking: Reported on 01/23/2023)  No current facility-administered medications on file prior to visit.  Medical / Surgical History  Past Medical History: Diagnosis Date DVT (deep venous thrombosis) (CMS/HHS-HCC) DVT (deep venous thrombosis) in arm (from IV) "Years ago" Hypercholesterolemia Hypertension Hypothyroid   Past Surgical  History: Procedure Laterality Date HYSTERECTOMY 1993 COLONOSCOPY 04/07/2008 Normal Colon: CBF 03/2018; Recall ltr mailed 01/11/18  (kj) LAMINECTOMY LUMBAR SPINE 10/15/2014 Dr. 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  General/Constitutional: No apparent distress: well-nourished and well developed. Eyes: Pupils equal, round with synchronous movement. Lymphatic: No palpable adenopathy. Respiratory: Patient has good chest wall movement with inspiration and expiration. Upon auscultation, patient does not have any wheezes, rhonchi or rails appreciated bilaterally. No crackles appreciated at the bases of the lungs Cardiovascular: Patient has a regular rate and rhythm without any appreciable murmurs, heaves, rubs or gallops. Distal posterior tibial pulses appreciated (2+). No edema, swelling or tenderness, except as noted in detailed exam. Integumentary: No impressive skin lesions present, except as noted in detailed exam. Neuro/Psych: Normal mood and affect, oriented to person, place and time. Musculoskeletal:  Left hip exam Upon inspection of the patient's left hip there does not appear to be any wounds, malalignments or gross deformities. Patient denies having much pain with palpation along the posterior lateral and anterior aspects of her hip joint. Does have noticeable discomfort with flexion and internal rotation of her hip. Patient has issues getting past neutral with internal rotation. Patient is neurovascularly intact bilaterally. Posterior tibial pulses appreciated on the left side, patient is able to plantar and dorsiflex her left foot with good strength and range of motion.  Imaging Imaging: No imaging was ordered at today's visit, her last x-ray series of her left hip was ordered on 11/30/2022.  Data No results found for: "WBC", "HGB", "HCT", "PLT" No results found for: "NA", "K", "CL", "CO2", "BUN", "CREATININE", "GLUCOSE" No results found for: "APTT", "INR" No results found for: "COLORU", "CLARITYU",  "SPECGRAV", "PHUR", "PROTEINUR", "GLUCOSEU", "KETONESU", "BLOODU", "NITRITE", "LEUKOCYTESUR", "BILIRUBINUR", "UROBILINOGEN", "RBCUA", "WBCUA", "SQUAMEPI", "CASTUA", "BACTERIA", "UACOMMENT"  Assesment and Plan CC: Left hip pain, osteoarthritis of left hip  I have recommended that Latoya Douglas undergo left total hip arthroplasty. Consents has been signed. The risks, benefits, prognosis and alternatives including but not limited to DVT, PE, infection, neurovascular injury, failure of the procedure and death were explained to the patient and she is willing to proceed with surgery as described to her by myself. Plan will be for post operative admission of at least 1 midnight for pain control and PT. She will be managed with DVT prophylaxis, antibiotics postoperatively for 24 hours and aggressive in patient rehab.  Pre, intra and post op interventions were discussed. Patient has good understanding  Medication Reconciliation was performed. Discussed cessation of hydrochlorothiazide and losartan, and Celebrex at appropriate times preoperatively, as well as discontinuing vitamins and supplements 7 days prior to her operation.  A total of 45 minutes was spent reviewing patient's charts, medical reconciliation, discussing/educating the patient about surgical interventions, and answering any questions provided by the patient.  Patient is cleared for surgery  JOSHUA Kendrick Fries, PA [facility] 01/23/2023  Electronically signed by Raenette Rover, PA at 01/23/2023 5:21 PM EDT

## 2023-01-29 ENCOUNTER — Ambulatory Visit: Payer: Medicare PPO | Admitting: Urgent Care

## 2023-01-29 ENCOUNTER — Observation Stay
Admission: RE | Admit: 2023-01-29 | Discharge: 2023-01-30 | Disposition: A | Discharge status: Home Health | Payer: Medicare PPO | Attending: Orthopedic Surgery | Admitting: Orthopedic Surgery

## 2023-01-29 ENCOUNTER — Other Ambulatory Visit: Payer: Self-pay

## 2023-01-29 ENCOUNTER — Ambulatory Visit: Payer: Medicare PPO | Admitting: Registered Nurse

## 2023-01-29 ENCOUNTER — Encounter: Payer: Self-pay | Admitting: Orthopedic Surgery

## 2023-01-29 ENCOUNTER — Encounter
Admission: RE | Disposition: A | Discharge status: Home Health | Payer: Self-pay | Source: Home / Self Care | Attending: Orthopedic Surgery

## 2023-01-29 ENCOUNTER — Observation Stay: Payer: Medicare PPO

## 2023-01-29 DIAGNOSIS — E039 Hypothyroidism, unspecified: Secondary | ICD-10-CM | POA: Insufficient documentation

## 2023-01-29 DIAGNOSIS — Z85828 Personal history of other malignant neoplasm of skin: Secondary | ICD-10-CM | POA: Insufficient documentation

## 2023-01-29 DIAGNOSIS — I10 Essential (primary) hypertension: Secondary | ICD-10-CM | POA: Insufficient documentation

## 2023-01-29 DIAGNOSIS — Z96642 Presence of left artificial hip joint: Secondary | ICD-10-CM

## 2023-01-29 DIAGNOSIS — M1612 Unilateral primary osteoarthritis, left hip: Secondary | ICD-10-CM | POA: Diagnosis present

## 2023-01-29 DIAGNOSIS — Z79899 Other long term (current) drug therapy: Secondary | ICD-10-CM | POA: Diagnosis not present

## 2023-01-29 DIAGNOSIS — R194 Change in bowel habit: Secondary | ICD-10-CM

## 2023-01-29 DIAGNOSIS — Z86718 Personal history of other venous thrombosis and embolism: Secondary | ICD-10-CM | POA: Diagnosis not present

## 2023-01-29 DIAGNOSIS — K2970 Gastritis, unspecified, without bleeding: Secondary | ICD-10-CM

## 2023-01-29 HISTORY — PX: TOTAL HIP ARTHROPLASTY: SHX124

## 2023-01-29 SURGERY — ARTHROPLASTY, HIP, TOTAL,POSTERIOR APPROACH
Anesthesia: General | Site: Hip | Laterality: Left

## 2023-01-29 MED ORDER — METOCLOPRAMIDE HCL 10 MG PO TABS
ORAL_TABLET | ORAL | Status: AC
  Filled 2023-01-29: qty 1

## 2023-01-29 MED ORDER — NEOMYCIN-POLYMYXIN-DEXAMETH 3.5-10000-0.1 OP OINT
1.0000 | TOPICAL_OINTMENT | Freq: Every day | OPHTHALMIC | Status: DC
  Filled 2023-01-29 (×2): qty 3.5

## 2023-01-29 MED ORDER — ACETAMINOPHEN 10 MG/ML IV SOLN: 1000.0000 mg | Freq: Once | INTRAVENOUS | Status: DC | PRN

## 2023-01-29 MED ORDER — MIDAZOLAM HCL 5 MG/5ML IJ SOLN
INTRAMUSCULAR | Status: DC | PRN
  Administered 2023-01-29: 1 mg via INTRAVENOUS

## 2023-01-29 MED ORDER — CEFAZOLIN SODIUM-DEXTROSE 2-4 GM/100ML-% IV SOLN
INTRAVENOUS | Status: AC
  Filled 2023-01-29: qty 100

## 2023-01-29 MED ORDER — ORAL CARE MOUTH RINSE: 15.0000 mL | Freq: Once | OROMUCOSAL | Status: AC

## 2023-01-29 MED ORDER — ACETAMINOPHEN 10 MG/ML IV SOLN
INTRAVENOUS | Status: AC
  Filled 2023-01-29: qty 100

## 2023-01-29 MED ORDER — CELECOXIB 200 MG PO CAPS
200.0000 mg | ORAL_CAPSULE | Freq: Two times a day (BID) | ORAL | Status: DC
  Administered 2023-01-29 – 2023-01-30 (×2): 200 mg via ORAL

## 2023-01-29 MED ORDER — OXYCODONE HCL 5 MG/5ML PO SOLN: 5.0000 mg | Freq: Once | ORAL | Status: AC | PRN

## 2023-01-29 MED ORDER — METOCLOPRAMIDE HCL 10 MG PO TABS
10.0000 mg | ORAL_TABLET | Freq: Three times a day (TID) | ORAL | Status: DC
  Administered 2023-01-29 – 2023-01-30 (×2): 10 mg via ORAL

## 2023-01-29 MED ORDER — OXYCODONE HCL 5 MG PO TABS
5.0000 mg | ORAL_TABLET | ORAL | Status: DC | PRN
  Administered 2023-01-29 – 2023-01-30 (×2): 5 mg via ORAL

## 2023-01-29 MED ORDER — CELECOXIB 200 MG PO CAPS
400.0000 mg | ORAL_CAPSULE | Freq: Once | ORAL | Status: AC
  Administered 2023-01-29: 400 mg via ORAL

## 2023-01-29 MED ORDER — ONDANSETRON HCL 4 MG/2ML IJ SOLN: 4.0000 mg | Freq: Four times a day (QID) | INTRAMUSCULAR | Status: DC | PRN

## 2023-01-29 MED ORDER — EPHEDRINE 5 MG/ML INJ
INTRAVENOUS | Status: AC
  Filled 2023-01-29: qty 5

## 2023-01-29 MED ORDER — FERROUS SULFATE 325 (65 FE) MG PO TABS
325.0000 mg | ORAL_TABLET | Freq: Two times a day (BID) | ORAL | Status: DC
  Administered 2023-01-29 – 2023-01-30 (×2): 325 mg via ORAL

## 2023-01-29 MED ORDER — SODIUM CHLORIDE 0.9 % IV SOLN: INTRAVENOUS | Status: DC

## 2023-01-29 MED ORDER — ASPIRIN 81 MG PO CHEW
CHEWABLE_TABLET | ORAL | Status: AC
  Filled 2023-01-29: qty 1

## 2023-01-29 MED ORDER — FAMOTIDINE 20 MG PO TABS
20.0000 mg | ORAL_TABLET | Freq: Once | ORAL | Status: AC
  Administered 2023-01-29: 20 mg via ORAL

## 2023-01-29 MED ORDER — TRANEXAMIC ACID-NACL 1000-0.7 MG/100ML-% IV SOLN
1000.0000 mg | INTRAVENOUS | Status: AC
  Administered 2023-01-29: 1000 mg via INTRAVENOUS

## 2023-01-29 MED ORDER — DEXAMETHASONE SODIUM PHOSPHATE 10 MG/ML IJ SOLN
8.0000 mg | Freq: Once | INTRAMUSCULAR | Status: AC
  Administered 2023-01-29: 8 mg via INTRAVENOUS

## 2023-01-29 MED ORDER — TRANEXAMIC ACID-NACL 1000-0.7 MG/100ML-% IV SOLN
INTRAVENOUS | Status: AC
  Filled 2023-01-29: qty 100

## 2023-01-29 MED ORDER — OLOPATADINE HCL 0.1 % OP SOLN: 1.0000 [drp] | Freq: Two times a day (BID) | OPHTHALMIC | Status: DC | PRN

## 2023-01-29 MED ORDER — BISACODYL 10 MG RE SUPP: 10.0000 mg | Freq: Every day | RECTAL | Status: DC | PRN

## 2023-01-29 MED ORDER — FENTANYL CITRATE (PF) 100 MCG/2ML IJ SOLN
25.0000 ug | INTRAMUSCULAR | Status: DC | PRN
  Administered 2023-01-29 (×3): 25 ug via INTRAVENOUS

## 2023-01-29 MED ORDER — SODIUM CHLORIDE 0.9 % IR SOLN
Status: DC | PRN
  Administered 2023-01-29: 3000 mL

## 2023-01-29 MED ORDER — ONDANSETRON HCL 4 MG/2ML IJ SOLN: 4.0000 mg | Freq: Once | INTRAMUSCULAR | Status: DC | PRN

## 2023-01-29 MED ORDER — AMLODIPINE BESYLATE 5 MG PO TABS
ORAL_TABLET | ORAL | Status: AC
  Filled 2023-01-29: qty 1

## 2023-01-29 MED ORDER — FLEET ENEMA 7-19 GM/118ML RE ENEM: 1.0000 | ENEMA | Freq: Once | RECTAL | Status: DC | PRN

## 2023-01-29 MED ORDER — SURGIRINSE WOUND IRRIGATION SYSTEM - OPTIME
TOPICAL | Status: DC | PRN
  Administered 2023-01-29: 450 mL

## 2023-01-29 MED ORDER — DIPHENHYDRAMINE HCL 12.5 MG/5ML PO ELIX: 12.5000 mg | ORAL_SOLUTION | ORAL | Status: DC | PRN

## 2023-01-29 MED ORDER — AMLODIPINE BESYLATE 5 MG PO TABS
2.5000 mg | ORAL_TABLET | Freq: Two times a day (BID) | ORAL | Status: DC
  Administered 2023-01-29: 2.5 mg via ORAL

## 2023-01-29 MED ORDER — ACETAMINOPHEN 10 MG/ML IV SOLN
1000.0000 mg | Freq: Four times a day (QID) | INTRAVENOUS | Status: DC
  Administered 2023-01-29 – 2023-01-30 (×3): 1000 mg via INTRAVENOUS
  Filled 2023-01-29: qty 100

## 2023-01-29 MED ORDER — MIDAZOLAM HCL 2 MG/2ML IJ SOLN
INTRAMUSCULAR | Status: AC
  Filled 2023-01-29: qty 2

## 2023-01-29 MED ORDER — PHENOL 1.4 % MT LIQD: 1.0000 | OROMUCOSAL | Status: DC | PRN

## 2023-01-29 MED ORDER — BUPIVACAINE HCL (PF) 0.5 % IJ SOLN
INTRAMUSCULAR | Status: DC | PRN
  Administered 2023-01-29: 3 mL

## 2023-01-29 MED ORDER — MAGNESIUM HYDROXIDE 400 MG/5ML PO SUSP
30.0000 mL | Freq: Every day | ORAL | Status: DC
  Administered 2023-01-30: 30 mL via ORAL

## 2023-01-29 MED ORDER — SENNOSIDES-DOCUSATE SODIUM 8.6-50 MG PO TABS
ORAL_TABLET | ORAL | Status: AC
  Filled 2023-01-29: qty 1

## 2023-01-29 MED ORDER — FENTANYL CITRATE (PF) 100 MCG/2ML IJ SOLN
INTRAMUSCULAR | Status: AC
  Filled 2023-01-29: qty 2

## 2023-01-29 MED ORDER — ACETAMINOPHEN 325 MG PO TABS: 325.0000 mg | ORAL_TABLET | Freq: Four times a day (QID) | ORAL | Status: DC | PRN

## 2023-01-29 MED ORDER — ONDANSETRON HCL 4 MG/2ML IJ SOLN
INTRAMUSCULAR | Status: DC | PRN
  Administered 2023-01-29: 4 mg via INTRAVENOUS

## 2023-01-29 MED ORDER — LOSARTAN POTASSIUM 50 MG PO TABS
100.0000 mg | ORAL_TABLET | ORAL | Status: DC
  Administered 2023-01-30: 100 mg via ORAL

## 2023-01-29 MED ORDER — CELECOXIB 200 MG PO CAPS
ORAL_CAPSULE | ORAL | Status: AC
  Filled 2023-01-29: qty 2

## 2023-01-29 MED ORDER — ONDANSETRON HCL 4 MG PO TABS: 4.0000 mg | ORAL_TABLET | Freq: Four times a day (QID) | ORAL | Status: DC | PRN

## 2023-01-29 MED ORDER — MENTHOL 3 MG MT LOZG: 1.0000 | LOZENGE | OROMUCOSAL | Status: DC | PRN

## 2023-01-29 MED ORDER — PROPOFOL 500 MG/50ML IV EMUL
INTRAVENOUS | Status: DC | PRN
  Administered 2023-01-29: 80 ug/kg/min via INTRAVENOUS

## 2023-01-29 MED ORDER — EPHEDRINE SULFATE (PRESSORS) 50 MG/ML IJ SOLN
INTRAMUSCULAR | Status: DC | PRN
  Administered 2023-01-29 (×6): 5 mg via INTRAVENOUS

## 2023-01-29 MED ORDER — OXYCODONE HCL 5 MG PO TABS
ORAL_TABLET | ORAL | Status: AC
  Filled 2023-01-29: qty 1

## 2023-01-29 MED ORDER — PANTOPRAZOLE SODIUM 40 MG PO TBEC
DELAYED_RELEASE_TABLET | ORAL | Status: AC
  Filled 2023-01-29: qty 1

## 2023-01-29 MED ORDER — ATORVASTATIN CALCIUM 20 MG PO TABS
ORAL_TABLET | ORAL | Status: AC
  Filled 2023-01-29: qty 2

## 2023-01-29 MED ORDER — LACTATED RINGERS IV SOLN: INTRAVENOUS | Status: DC

## 2023-01-29 MED ORDER — 0.9 % SODIUM CHLORIDE (POUR BTL) OPTIME
TOPICAL | Status: DC | PRN
  Administered 2023-01-29: 500 mL

## 2023-01-29 MED ORDER — CEFAZOLIN SODIUM-DEXTROSE 2-4 GM/100ML-% IV SOLN
2.0000 g | Freq: Four times a day (QID) | INTRAVENOUS | Status: AC
  Administered 2023-01-29 (×2): 2 g via INTRAVENOUS

## 2023-01-29 MED ORDER — ASPIRIN 81 MG PO CHEW
81.0000 mg | CHEWABLE_TABLET | Freq: Two times a day (BID) | ORAL | Status: DC
  Administered 2023-01-29 – 2023-01-30 (×2): 81 mg via ORAL

## 2023-01-29 MED ORDER — GABAPENTIN 300 MG PO CAPS
ORAL_CAPSULE | ORAL | Status: AC
  Filled 2023-01-29: qty 1

## 2023-01-29 MED ORDER — HYDROCHLOROTHIAZIDE 25 MG PO TABS
25.0000 mg | ORAL_TABLET | ORAL | Status: DC
  Administered 2023-01-30: 25 mg via ORAL

## 2023-01-29 MED ORDER — TRANEXAMIC ACID-NACL 1000-0.7 MG/100ML-% IV SOLN
1000.0000 mg | Freq: Once | INTRAVENOUS | Status: AC
  Administered 2023-01-29: 1000 mg via INTRAVENOUS

## 2023-01-29 MED ORDER — CHLORHEXIDINE GLUCONATE 0.12 % MT SOLN
15.0000 mL | Freq: Once | OROMUCOSAL | Status: AC
  Administered 2023-01-29: 15 mL via OROMUCOSAL

## 2023-01-29 MED ORDER — ONDANSETRON HCL 4 MG/2ML IJ SOLN
INTRAMUSCULAR | Status: AC
  Filled 2023-01-29: qty 2

## 2023-01-29 MED ORDER — CEFAZOLIN SODIUM-DEXTROSE 2-4 GM/100ML-% IV SOLN
2.0000 g | INTRAVENOUS | Status: AC
  Administered 2023-01-29: 2 g via INTRAVENOUS

## 2023-01-29 MED ORDER — ENSURE PRE-SURGERY PO LIQD
296.0000 mL | Freq: Once | ORAL | Status: DC
  Filled 2023-01-29: qty 296

## 2023-01-29 MED ORDER — LEVOTHYROXINE SODIUM 75 MCG PO TABS
75.0000 ug | ORAL_TABLET | Freq: Every day | ORAL | Status: DC
  Administered 2023-01-30: 75 ug via ORAL
  Filled 2023-01-29: qty 1

## 2023-01-29 MED ORDER — POLYVINYL ALCOHOL 1.4 % OP SOLN: 1.0000 [drp] | Freq: Every day | OPHTHALMIC | Status: DC | PRN

## 2023-01-29 MED ORDER — ACETAMINOPHEN 10 MG/ML IV SOLN
INTRAVENOUS | Status: DC | PRN
  Administered 2023-01-29: 1000 mg via INTRAVENOUS

## 2023-01-29 MED ORDER — HYDROMORPHONE HCL 1 MG/ML IJ SOLN: 0.5000 mg | INTRAMUSCULAR | Status: DC | PRN

## 2023-01-29 MED ORDER — PROPOFOL 1000 MG/100ML IV EMUL
INTRAVENOUS | Status: AC
  Filled 2023-01-29: qty 100

## 2023-01-29 MED ORDER — SENNOSIDES-DOCUSATE SODIUM 8.6-50 MG PO TABS
1.0000 | ORAL_TABLET | Freq: Two times a day (BID) | ORAL | Status: DC
  Administered 2023-01-29 – 2023-01-30 (×2): 1 via ORAL

## 2023-01-29 MED ORDER — GABAPENTIN 300 MG PO CAPS
300.0000 mg | ORAL_CAPSULE | Freq: Once | ORAL | Status: AC
  Administered 2023-01-29: 300 mg via ORAL

## 2023-01-29 MED ORDER — CHLORHEXIDINE GLUCONATE 4 % EX SOLN: 60.0000 mL | Freq: Once | CUTANEOUS | Status: DC

## 2023-01-29 MED ORDER — TRAMADOL HCL 50 MG PO TABS: 50.0000 mg | ORAL_TABLET | ORAL | Status: DC | PRN

## 2023-01-29 MED ORDER — FAMOTIDINE 20 MG PO TABS
ORAL_TABLET | ORAL | Status: AC
  Filled 2023-01-29: qty 1

## 2023-01-29 MED ORDER — CELECOXIB 200 MG PO CAPS
ORAL_CAPSULE | ORAL | Status: AC
  Filled 2023-01-29: qty 1

## 2023-01-29 MED ORDER — DEXAMETHASONE SODIUM PHOSPHATE 10 MG/ML IJ SOLN
INTRAMUSCULAR | Status: AC
  Filled 2023-01-29: qty 1

## 2023-01-29 MED ORDER — OXYCODONE HCL 5 MG PO TABS: 10.0000 mg | ORAL_TABLET | ORAL | Status: DC | PRN

## 2023-01-29 MED ORDER — FERROUS SULFATE 325 (65 FE) MG PO TABS
ORAL_TABLET | ORAL | Status: AC
  Filled 2023-01-29: qty 1

## 2023-01-29 MED ORDER — ATORVASTATIN CALCIUM 20 MG PO TABS
40.0000 mg | ORAL_TABLET | Freq: Every day | ORAL | Status: DC
  Administered 2023-01-29: 40 mg via ORAL

## 2023-01-29 MED ORDER — PROPOFOL 10 MG/ML IV BOLUS
INTRAVENOUS | Status: DC | PRN
  Administered 2023-01-29: 20 mg via INTRAVENOUS

## 2023-01-29 MED ORDER — PHENYLEPHRINE HCL-NACL 20-0.9 MG/250ML-% IV SOLN
INTRAVENOUS | Status: DC | PRN
  Administered 2023-01-29: 40 ug/min via INTRAVENOUS

## 2023-01-29 MED ORDER — CHLORHEXIDINE GLUCONATE 0.12 % MT SOLN
OROMUCOSAL | Status: AC
  Filled 2023-01-29: qty 15

## 2023-01-29 MED ORDER — ALUM & MAG HYDROXIDE-SIMETH 200-200-20 MG/5ML PO SUSP: 30.0000 mL | ORAL | Status: DC | PRN

## 2023-01-29 MED ORDER — PANTOPRAZOLE SODIUM 40 MG PO TBEC
40.0000 mg | DELAYED_RELEASE_TABLET | Freq: Two times a day (BID) | ORAL | Status: DC
  Administered 2023-01-29 – 2023-01-30 (×2): 40 mg via ORAL

## 2023-01-29 MED ORDER — OXYCODONE HCL 5 MG PO TABS
5.0000 mg | ORAL_TABLET | Freq: Once | ORAL | Status: AC | PRN
  Administered 2023-01-29: 5 mg via ORAL

## 2023-01-29 SURGICAL SUPPLY — 53 items
BLADE SAW 90X25X1.19 OSCILLAT (BLADE) ×1 IMPLANT
BRUSH SCRUB EZ PLAIN DRY (MISCELLANEOUS) ×1 IMPLANT
DRAPE 3/4 80X56 (DRAPES) ×1 IMPLANT
DRAPE INCISE IOBAN 66X60 STRL (DRAPES) ×1 IMPLANT
DRSG AQUACEL AG ADV 3.5X14 (GAUZE/BANDAGES/DRESSINGS) ×1 IMPLANT
DRSG DERMACEA 8X12 NADH (GAUZE/BANDAGES/DRESSINGS) IMPLANT
DRSG MEPILEX SACRM 8.7X9.8 (GAUZE/BANDAGES/DRESSINGS) ×1 IMPLANT
DRSG NON-ADHERENT DERMACEA 3X4 (GAUZE/BANDAGES/DRESSINGS) ×1 IMPLANT
DRSG TEGADERM 4X4.75 (GAUZE/BANDAGES/DRESSINGS) ×1 IMPLANT
DURAPREP 26ML APPLICATOR (WOUND CARE) ×2 IMPLANT
ELECT CAUTERY BLADE 6.4 (BLADE) ×1 IMPLANT
ELECT REM PT RETURN 9FT ADLT (ELECTROSURGICAL) ×1
ELECTRODE REM PT RTRN 9FT ADLT (ELECTROSURGICAL) ×1 IMPLANT
GLOVE BIOGEL M STRL SZ7.5 (GLOVE) ×4 IMPLANT
GLOVE SRG 8 PF TXTR STRL LF DI (GLOVE) ×2 IMPLANT
GLOVE SURG UNDER POLY LF SZ8 (GLOVE) ×2
GOWN STRL REUS W/ TWL LRG LVL3 (GOWN DISPOSABLE) ×2 IMPLANT
GOWN STRL REUS W/ TWL XL LVL3 (GOWN DISPOSABLE) ×1 IMPLANT
GOWN STRL REUS W/TWL LRG LVL3 (GOWN DISPOSABLE) ×2
GOWN STRL REUS W/TWL XL LVL3 (GOWN DISPOSABLE) ×1
GOWN TOGA ZIPPER T7+ PEEL AWAY (MISCELLANEOUS) ×1 IMPLANT
HANDLE YANKAUER SUCT OPEN TIP (MISCELLANEOUS) ×1 IMPLANT
HEAD FEM STD 32X+1 STRL (Hips) IMPLANT
HEMOVAC 400CC 10FR (MISCELLANEOUS) ×1 IMPLANT
HOLDER FOLEY CATH W/STRAP (MISCELLANEOUS) ×1 IMPLANT
HOOD PEEL AWAY T7 (MISCELLANEOUS) ×1 IMPLANT
IV NS IRRIG 3000ML ARTHROMATIC (IV SOLUTION) ×1 IMPLANT
KIT PEG BOARD PINK (KITS) ×1 IMPLANT
KIT TURNOVER KIT A (KITS) ×1 IMPLANT
LINER MARATHON 10 DEG 32MMX450 (Hips) ×1 IMPLANT
LINER MARATHON 10D 32MMX450 (Hips) IMPLANT
MANIFOLD NEPTUNE II (INSTRUMENTS) ×2 IMPLANT
NS IRRIG 500ML POUR BTL (IV SOLUTION) ×1 IMPLANT
PACK HIP PROSTHESIS (MISCELLANEOUS) ×1 IMPLANT
PIN SECT CUP 50MM (Hips) IMPLANT
PULSAVAC PLUS IRRIG FAN TIP (DISPOSABLE) ×1
SOL PREP PVP 2OZ (MISCELLANEOUS) ×1
SOLUTION IRRIG SURGIPHOR (IV SOLUTION) ×1 IMPLANT
SOLUTION PREP PVP 2OZ (MISCELLANEOUS) ×1 IMPLANT
SPONGE DRAIN TRACH 4X4 STRL 2S (GAUZE/BANDAGES/DRESSINGS) ×1 IMPLANT
STAPLER SKIN PROX 35W (STAPLE) ×1 IMPLANT
STEM FEM ACTIS HIGH SZ3 (Stem) IMPLANT
SUT ETHIBOND #5 BRAIDED 30INL (SUTURE) ×1 IMPLANT
SUT VIC AB 0 CT1 36 (SUTURE) ×2 IMPLANT
SUT VIC AB 1 CT1 36 (SUTURE) ×2 IMPLANT
SUT VIC AB 2-0 CT1 27 (SUTURE) ×1
SUT VIC AB 2-0 CT1 TAPERPNT 27 (SUTURE) ×1 IMPLANT
TAPE CLOTH 3X10 WHT NS LF (GAUZE/BANDAGES/DRESSINGS) ×1 IMPLANT
TIP FAN IRRIG PULSAVAC PLUS (DISPOSABLE) ×1 IMPLANT
TOWEL OR 17X26 4PK STRL BLUE (TOWEL DISPOSABLE) IMPLANT
TRAP FLUID SMOKE EVACUATOR (MISCELLANEOUS) ×1 IMPLANT
TRAY FOLEY MTR SLVR 16FR STAT (SET/KITS/TRAYS/PACK) ×1 IMPLANT
WATER STERILE IRR 1000ML POUR (IV SOLUTION) ×1 IMPLANT

## 2023-01-29 NOTE — Anesthesia Preprocedure Evaluation (Addendum)
Anesthesia Evaluation  Patient identified by MRN, date of birth, ID band Patient awake    Reviewed: Allergy & Precautions, NPO status , Patient's Chart, lab work & pertinent test results  History of Anesthesia Complications Negative for: history of anesthetic complications  Airway Mallampati: III   Neck ROM: Full    Dental  (+) Chipped   Pulmonary neg pulmonary ROS   Pulmonary exam normal breath sounds clear to auscultation       Cardiovascular hypertension, Normal cardiovascular exam Rhythm:Regular Rate:Normal  Remote hx UE DVT associated with IV placement, no longer on anticoagulation  ECG 01/18/23:  Sinus bradycardia Right atrial enlargement Left axis deviation Pulmonary disease pattern Abnormal ECG When compared with ECG of 26-Oct-2016 19:55, No significant change was found   Neuro/Psych negative neurological ROS     GI/Hepatic hiatal hernia,GERD  ,,  Endo/Other  Hypothyroidism  Prediabetes   Renal/GU negative Renal ROS     Musculoskeletal  (+) Arthritis ,    Abdominal   Peds  Hematology negative hematology ROS (+)   Anesthesia Other Findings   Reproductive/Obstetrics                             Anesthesia Physical Anesthesia Plan  ASA: 2  Anesthesia Plan: General and Spinal   Post-op Pain Management:    Induction: Intravenous  PONV Risk Score and Plan: 3 and Propofol infusion, TIVA, Treatment may vary due to age or medical condition and Ondansetron  Airway Management Planned: Natural Airway and Nasal Cannula  Additional Equipment:   Intra-op Plan:   Post-operative Plan:   Informed Consent: I have reviewed the patients History and Physical, chart, labs and discussed the procedure including the risks, benefits and alternatives for the proposed anesthesia with the patient or authorized representative who has indicated his/her understanding and acceptance.        Plan Discussed with: CRNA  Anesthesia Plan Comments: (Plan for spinal and GA with natural airway, LMA/GETA backup.  Patient consented for risks of anesthesia including but not limited to:  - adverse reactions to medications - damage to eyes, teeth, lips or other oral mucosa - nerve damage due to positioning  - sore throat or hoarseness - headache, bleeding, infection, nerve damage 2/2 spinal - damage to heart, brain, nerves, lungs, other parts of body or loss of life  Informed patient about role of CRNA in peri- and intra-operative care.  Patient voiced understanding.)        Anesthesia Quick Evaluation

## 2023-01-29 NOTE — Op Note (Signed)
OPERATIVE NOTE  DATE OF SURGERY:  01/29/2023  PATIENT NAME:  Latoya Douglas   DOB: April 27, 1941  MRN: [id number]  PRE-OPERATIVE DIAGNOSIS: Degenerative arthrosis of the left hip, primary  POST-OPERATIVE DIAGNOSIS:  Same  PROCEDURE:  Left total hip arthroplasty  SURGEON:  Jena Gauss. M.D.  ASSISTANT:  Gean Birchwood, PA-C (present and scrubbed throughout the case, critical for assistance with exposure, retraction, instrumentation, and closure)  ANESTHESIA: spinal  ESTIMATED BLOOD LOSS: 75 mL  FLUIDS REPLACED: 850 mL of crystalloid  DRAINS: 2 medium Hemovac drains  IMPLANTS UTILIZED: DePuy size 3 high offset Actis femoral stem, 50 mm OD Pinnacle 100 acetabular component, +4 mm 10 degree Pinnacle Marathon polyethylene insert, and a 32 mm CoCr +1 mm hip ball  INDICATIONS FOR SURGERY: Latoya Douglas is a 82 y.o. year old female with a long history of progressive hip and groin  pain. X-rays demonstrated severe degenerative changes. The patient had not seen any significant improvement despite conservative nonsurgical intervention. After discussion of the risks and benefits of surgical intervention, the patient expressed understanding of the risks benefits and agree with plans for total hip arthroplasty.   The risks, benefits, and alternatives were discussed at length including but not limited to the risks of infection, bleeding, nerve injury, stiffness, blood clots, the need for revision surgery, limb length inequality, dislocation, cardiopulmonary complications, among others, and they were willing to proceed.  PROCEDURE IN DETAIL: The patient was brought into the operating room and, after adequate spinal anesthesia was achieved, the patient was placed in a right lateral decubitus position. Axillary roll was placed and all bony prominences were well-padded. The patient's left hip was cleaned and prepped with alcohol and DuraPrep and draped in the usual sterile fashion. A "timeout" was  performed as per usual protocol. A lateral curvilinear incision was made gently curving towards the posterior superior iliac spine. The IT band was incised in line with the skin incision and the fibers of the gluteus maximus were split in line. The piriformis tendon was identified, skeletonized, and incised at its insertion to the proximal femur and reflected posteriorly. A T type posterior capsulotomy was performed. Prior to dislocation of the femoral head, a threaded Steinmann pin was inserted through a separate stab incision into the pelvis superior to the acetabulum and bent in the form of a stylus so as to assess limb length and hip offset throughout the procedure. The femoral head was then dislocated posteriorly. Inspection of the femoral head demonstrated severe degenerative changes with full-thickness loss of articular cartilage. The femoral neck cut was performed using an oscillating saw. The anterior capsule was elevated off of the femoral neck using a periosteal elevator. Attention was then directed to the acetabulum. The remnant of the labrum was excised using electrocautery. Inspection of the acetabulum also demonstrated significant degenerative changes. The acetabulum was reamed in sequential fashion up to a 49 mm diameter. Good punctate bleeding bone was encountered. A 50 mm Pinnacle 100 acetabular component was positioned and impacted into place. Good scratch fit was appreciated. A +4 mm neutral polyethylene trial was inserted.  Attention was then directed to the proximal femur.  Femoral broaches were inserted in a sequential fashion up to a size 3 broach. Calcar region was planed and a trial reduction was performed using a standard offset neck and a 32 mm hip ball with a +1 mm neck length.  Is noted to be decreased offset and only fair stability.  It was elected to use  a high offset neck segment with a +4 mm 10 degree polyethylene trial with the high side position at the 5 o'clock position.  Good  equalization of limb lengths and hip offset was appreciated and excellent stability was noted both anteriorly and posteriorly. Trial components were removed. The acetabular shell was irrigated with copious amounts of normal saline with antibiotic solution and suctioned dry. A +4 mm 10 degree Pinnacle Marathon polyethylene insert was positioned with the high side at the 5 o'clock position and impacted into place. Next, a size 3 high offset Actis femoral stem was positioned and impacted into place. Excellent scratch fit was appreciated. A trial reduction was again performed with a 32 mm hip ball with a +1 mm neck length. Again, good equalization of limb lengths was appreciated and excellent stability appreciated both anteriorly and posteriorly. The hip was then dislocated and the trial hip ball was removed. The Morse taper was cleaned and dried. A 32 mm cobalt chromium hip ball with a +1 mm neck length was placed on the trunnion and impacted into place. The hip was then reduced and placed through range of motion. Excellent stability was appreciated both anteriorly and posteriorly.  The wound was irrigated with copious amounts of normal saline followed by 450 ml of Surgiphor and suctioned dry. Good hemostasis was appreciated. The posterior capsulotomy was repaired using #5 Ethibond. Piriformis tendon was reapproximated to the undersurface of the gluteus medius tendon using #5 Ethibond. The IT band was reapproximated using interrupted sutures of #1 Vicryl. Subcutaneous tissue was approximated using first #0 Vicryl followed by #2-0 Vicryl. The skin was closed with skin staples.  The patient tolerated the procedure well and was transported to the recovery room in stable condition.   Jena Gauss., M.D.

## 2023-01-29 NOTE — Anesthesia Postprocedure Evaluation (Signed)
Anesthesia Post Note  Patient: Latoya Douglas  Procedure(s) Performed: TOTAL HIP ARTHROPLASTY (Left: Hip)  Patient location during evaluation: PACU Anesthesia Type: Spinal Level of consciousness: awake and alert, oriented and patient cooperative Pain management: pain level controlled Vital Signs Assessment: post-procedure vital signs reviewed and stable Respiratory status: spontaneous breathing, nonlabored ventilation and respiratory function stable Cardiovascular status: blood pressure returned to baseline and stable Postop Assessment: adequate PO intake, no headache, no backache and spinal receding Anesthetic complications: no   No notable events documented.   Last Vitals:  Vitals:   01/29/23 1445 01/29/23 1500  BP: (!) 104/58 (!) 103/55  Pulse: 89 90  Resp: 19 16  Temp:  (!) 36.3 C  SpO2: 100% 97%    Last Pain:  Vitals:   01/29/23 1500  TempSrc:   PainSc: 0-No pain    LLE Motor Response: Purposeful movement;Responds to commands (01/29/23 1500) LLE Sensation: Decreased;No numbness;No pain;No tingling (01/29/23 1500) RLE Motor Response: Purposeful movement;Responds to commands (01/29/23 1500) RLE Sensation: Decreased;No numbness;No pain;No tingling (01/29/23 1500)      Reed Breech

## 2023-01-29 NOTE — Interval H&P Note (Signed)
History and Physical Interval Note:  01/29/2023 11:05 AM  Latoya Douglas  has presented today for surgery, with the diagnosis of PRIMARY OSTEOARTHRITIS OF LEFT HIP.  The various methods of treatment have been discussed with the patient and family. After consideration of risks, benefits and other options for treatment, the patient has consented to  Procedure(s): TOTAL HIP ARTHROPLASTY (Left) as a surgical intervention.  The patient's history has been reviewed, patient examined, no change in status, stable for surgery.  I have reviewed the patient's chart and labs.  Questions were answered to the patient's satisfaction.     Korben Carcione P Lyndall Bellot

## 2023-01-29 NOTE — Anesthesia Procedure Notes (Signed)
Spinal  Patient location during procedure: OR Start time: 01/29/2023 11:42 AM Reason for block: surgical anesthesia Staffing Performed: other anesthesia staff  Other anesthesia staff: Allayne Butcher, RN Performed by: Reece Agar, CRNA Authorized by: Reed Breech, MD   Preanesthetic Checklist Completed: patient identified, IV checked, site marked, risks and benefits discussed, surgical consent, monitors and equipment checked, pre-op evaluation and timeout performed Spinal Block Patient position: sitting Prep: DuraPrep Patient monitoring: heart rate, cardiac monitor, continuous pulse ox and blood pressure Approach: midline Location: L3-4 Injection technique: single-shot Needle Needle type: Pencan  Needle gauge: 24 G Needle length: 9 cm Assessment Sensory level: T4 Events: CSF return

## 2023-01-29 NOTE — Transfer of Care (Signed)
Immediate Anesthesia Transfer of Care Note  Patient: Latoya Douglas  Procedure(s) Performed: TOTAL HIP ARTHROPLASTY (Left: Hip)  Patient Location: PACU  Anesthesia Type:General and Spinal  Level of Consciousness: drowsy  Airway & Oxygen Therapy: Patient Spontanous Breathing and Patient connected to face mask oxygen  Post-op Assessment: Report given to RN and Post -op Vital signs reviewed and stable  Post vital signs: Reviewed and stable  Last Vitals:  Vitals Value Taken Time  BP 108/54 01/29/23 1435  Temp    Pulse 82 01/29/23 1437  Resp 15 01/29/23 1437  SpO2 100 % 01/29/23 1437  Vitals shown include unvalidated device data.  Last Pain:  Vitals:   01/29/23 0930  TempSrc: Temporal  PainSc: 0-No pain         Complications: No notable events documented.

## 2023-01-30 ENCOUNTER — Encounter: Payer: Self-pay | Admitting: Orthopedic Surgery

## 2023-01-30 DIAGNOSIS — M1612 Unilateral primary osteoarthritis, left hip: Secondary | ICD-10-CM | POA: Diagnosis not present

## 2023-01-30 MED ORDER — HYDROCHLOROTHIAZIDE 25 MG PO TABS
ORAL_TABLET | ORAL | Status: AC
  Filled 2023-01-30: qty 1

## 2023-01-30 MED ORDER — OXYCODONE HCL 5 MG PO TABS
ORAL_TABLET | ORAL | Status: AC
  Filled 2023-01-30: qty 1

## 2023-01-30 MED ORDER — CELECOXIB 200 MG PO CAPS: 200.0000 mg | ORAL_CAPSULE | Freq: Two times a day (BID) | ORAL | 1 refills | Status: AC

## 2023-01-30 MED ORDER — ACETAMINOPHEN 500 MG PO TABS
ORAL_TABLET | ORAL | Status: AC
  Filled 2023-01-30: qty 2

## 2023-01-30 MED ORDER — MAGNESIUM HYDROXIDE 400 MG/5ML PO SUSP
ORAL | Status: AC
  Filled 2023-01-30: qty 30

## 2023-01-30 MED ORDER — TRAMADOL HCL 50 MG PO TABS: 50.0000 mg | ORAL_TABLET | ORAL | 0 refills | Status: AC | PRN

## 2023-01-30 MED ORDER — FERROUS SULFATE 325 (65 FE) MG PO TABS
ORAL_TABLET | ORAL | Status: AC
  Filled 2023-01-30: qty 1

## 2023-01-30 MED ORDER — ASPIRIN 81 MG PO CHEW
CHEWABLE_TABLET | ORAL | Status: AC
  Filled 2023-01-30: qty 1

## 2023-01-30 MED ORDER — METOCLOPRAMIDE HCL 10 MG PO TABS
ORAL_TABLET | ORAL | Status: AC
  Filled 2023-01-30: qty 1

## 2023-01-30 MED ORDER — ASPIRIN 81 MG PO TBEC: 81.0000 mg | DELAYED_RELEASE_TABLET | Freq: Two times a day (BID) | ORAL | 0 refills | Status: AC

## 2023-01-30 MED ORDER — OXYCODONE HCL 5 MG PO TABS: 5.0000 mg | ORAL_TABLET | ORAL | 0 refills | Status: AC | PRN

## 2023-01-30 MED ORDER — LOSARTAN POTASSIUM 50 MG PO TABS
ORAL_TABLET | ORAL | Status: AC
  Filled 2023-01-30: qty 2

## 2023-01-30 MED ORDER — CELECOXIB 200 MG PO CAPS
ORAL_CAPSULE | ORAL | Status: AC
  Filled 2023-01-30: qty 1

## 2023-01-30 MED ORDER — SENNOSIDES-DOCUSATE SODIUM 8.6-50 MG PO TABS
ORAL_TABLET | ORAL | Status: AC
  Filled 2023-01-30: qty 1

## 2023-01-30 MED ORDER — PANTOPRAZOLE SODIUM 40 MG PO TBEC
DELAYED_RELEASE_TABLET | ORAL | Status: AC
  Filled 2023-01-30: qty 1

## 2023-01-30 NOTE — Plan of Care (Signed)
  Problem: Activity: Goal: Ability to tolerate increased activity will improve Outcome: Progressing   Problem: Pain Management: Goal: Pain level will decrease with appropriate interventions Outcome: Progressing   Problem: Skin Integrity: Goal: Will show signs of wound healing Outcome: Progressing   

## 2023-01-30 NOTE — TOC Transition Note (Signed)
Transition of Care [facility]) - CM/SW Discharge Note   Patient Details  Name: Latoya Douglas MRN: [id number] Date of Birth: March 06, 1941  [facility] CM/SW Contact:  Garret Reddish, RN Phone Number: 01/30/2023, 12:13 PM   Clinical Narrative:  Chart reviewed.  Noted that patient has discharge orders for today.    Noted that patient has been pre-arranged with [facility] for PT and OT services on discharge.  Patient is agreeable to using [facility] for [facility] services.    Patient will need a 2 wheeled rolling walker on discharge.   I have asked [employer] to provide a 2 wheeled rolling walker to bedside today.  Patient will not need a BSC.    I have informed staff nurse of the above information.      Final next level of care: Home w Home Health Services Barriers to Discharge: No Barriers Identified   Patient Goals and CMS Choice CMS Medicare.gov Compare Post Acute Care list provided to:: Patient Choice offered to / list presented to : Patient  Discharge Placement                      Patient and family notified of of transfer: 01/30/23  Discharge Plan and Services Additional resources added to the After Visit Summary for                  DME Arranged: Walker rolling DME Agency: [employer] Date DME Agency Contacted: 01/30/23 Time DME Agency Contacted: 1100 Representative spoke with at DME Agency: Meriem Cower HH Arranged: PT, OT HH Agency: [facility] Date [facility] Agency Contacted: 01/30/23 Time HH Agency Contacted: 1000 Representative spoke with at [facility] Agency: [place]  Social Determinants of Health (SDOH) Interventions SDOH Screenings   Food Insecurity: No Food Insecurity (01/29/2023)  Housing: Low Risk  (01/29/2023)  Transportation Needs: No Transportation Needs (01/29/2023)  Utilities: Not At Risk (01/29/2023)  Tobacco Use: Low Risk  (01/30/2023)     Readmission Risk Interventions     No data to display

## 2023-01-30 NOTE — Progress Notes (Signed)
Patient is not able to walk the distance required to go the bathroom, or he/she is unable to safely negotiate stairs required to access the bathroom.  A 3in1 BSC will alleviate this problem   Latoya Douglas P. Latoya Douglas, Jr. M.D.  

## 2023-01-30 NOTE — Progress Notes (Signed)
DISCHARGE NOTE:  Pt given discharge instructions and scripts. Pt verbalized understanding. Walker sent with pt. Pt wheeled to car by staff, son providing transportation.

## 2023-01-30 NOTE — Evaluation (Signed)
Physical Therapy Evaluation Patient Details Name: Latoya Douglas MRN: [id number] DOB: 07-31-1941 Today's Date: 01/30/2023  History of Present Illness  Latoya Douglas is a 82 y.o. female who has severe left hip pain and has failed conservative treatment. S/p L THA with Dr. Ernest Pine on 01/29/23.  Clinical Impression  Patient received seated on side of bed. She is agreeable to PT assessment. Patient stands with cues for hand placement, no assist needed. She is able to ambulate 250 feet with RW and supervision and ambulated up/down 4 steps with B rails and supervision. She is doing very well and will continue to benefit from skilled PT to maximize independence and safety. Reviewed HEP and precautions with handout provided. Patient verbalized understanding.          Recommendations for follow up therapy are one component of a multi-disciplinary discharge planning process, led by the attending physician.  Recommendations may be updated based on patient status, additional functional criteria and insurance authorization.  Follow Up Recommendations       Assistance Recommended at Discharge PRN  Patient can return home with the following  A little help with walking and/or transfers;A little help with bathing/dressing/bathroom;Assist for transportation;Help with stairs or ramp for entrance;Assistance with cooking/housework    Equipment Recommendations None recommended by PT  Recommendations for Other Services       Functional Status Assessment Patient has had a recent decline in their functional status and demonstrates the ability to make significant improvements in function in a reasonable and predictable amount of time.     Precautions / Restrictions Precautions Precautions: Posterior Hip;Fall Precaution Booklet Issued: Yes (comment) Restrictions Weight Bearing Restrictions: Yes LLE Weight Bearing: Weight bearing as tolerated      Mobility  Bed Mobility               General bed  mobility comments: NT    Transfers Overall transfer level: Modified independent Equipment used: Rolling walker (2 wheels)                    Ambulation/Gait Ambulation/Gait assistance: Supervision Gait Distance (Feet): 250 Feet Assistive device: Rolling walker (2 wheels) Gait Pattern/deviations: Step-through pattern, Decreased step length - right, Decreased step length - left, Decreased stride length Gait velocity: slightly decreased     General Gait Details: safe with ambulation using RW  Stairs Stairs: Yes Stairs assistance: Supervision Stair Management: Two rails, Forwards Number of Stairs: 4 General stair comments: safe on steps, cues for sequencing  Wheelchair Mobility    Modified Rankin (Stroke Patients Only)       Balance Overall balance assessment: Modified Independent                                           Pertinent Vitals/Pain Pain Assessment Pain Assessment: 0-10 Pain Score: 2  Pain Location: L hip Pain Descriptors / Indicators: Discomfort Pain Intervention(s): Monitored during session, Repositioned    Home Living Family/patient expects to be discharged to:: Private residence Living Arrangements: Spouse/significant other;Children Available Help at Discharge: Family;Available 24 hours/day Type of Home: House Home Access: Stairs to enter Entrance Stairs-Rails: Right;Left;Can reach both Entrance Stairs-Number of Steps: 4   Home Layout: One level Home Equipment: Agricultural consultant (2 wheels) Additional Comments: spouse has dementia    Prior Function Prior Level of Function : Independent/Modified Independent;Driving  Mobility Comments: MOD I using SPC 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     Hand Dominance        Extremity/Trunk Assessment   Upper Extremity Assessment Upper Extremity Assessment: Defer to OT evaluation    Lower Extremity Assessment Lower Extremity Assessment: LLE  deficits/detail LLE Sensation: WNL LLE Coordination: decreased gross motor    Cervical / Trunk Assessment Cervical / Trunk Assessment: Normal  Communication   Communication: No difficulties  Cognition Arousal/Alertness: Awake/alert Behavior During Therapy: WFL for tasks assessed/performed Overall Cognitive Status: Within Functional Limits for tasks assessed                                          General Comments      Exercises Total Joint Exercises Ankle Circles/Pumps: AROM, Both, 10 reps Towel Squeeze: AROM, Both, 10 reps Long Arc Quad: AROM, Left, 5 reps   Assessment/Plan    PT Assessment Patient needs continued PT services  PT Problem List Decreased strength;Decreased range of motion;Decreased activity tolerance;Decreased balance;Decreased mobility;Decreased knowledge of precautions;Pain;Decreased skin integrity       PT Treatment Interventions DME instruction;Gait training;Therapeutic exercise;Balance training;Stair training;Functional mobility training;Therapeutic activities;Patient/family education;Neuromuscular re-education;Modalities    PT Goals (Current goals can be found in the Care Plan section)  Acute Rehab PT Goals Patient Stated Goal: return home today PT Goal Formulation: With patient Time For Goal Achievement: 01/31/23 Potential to Achieve Goals: Good    Frequency BID     Co-evaluation               AM-PAC PT "6 Clicks" Mobility  Outcome Measure Help needed turning from your back to your side while in a flat bed without using bedrails?: A Little Help needed moving from lying on your back to sitting on the side of a flat bed without using bedrails?: A Little Help needed moving to and from a bed to a chair (including a wheelchair)?: A Little Help needed standing up from a chair using your arms (e.g., wheelchair or bedside chair)?: A Little Help needed to walk in hospital room?: A Little Help needed climbing 3-5 steps with a  railing? : A Little 6 Click Score: 18    End of Session Equipment Utilized During Treatment: Gait belt Activity Tolerance: Patient tolerated treatment well Patient left: in chair;with call bell/phone within reach Nurse Communication: Mobility status PT Visit Diagnosis: Muscle weakness (generalized) (M62.81);Difficulty in walking, not elsewhere classified (R26.2);Other abnormalities of gait and mobility (R26.89);Pain Pain - Right/Left: Left Pain - part of body: Hip    Time: 0920-0937 PT Time Calculation (min) (ACUTE ONLY): 17 min   Charges:   PT Evaluation $PT Eval Moderate Complexity: 1 Mod PT Treatments $Gait Training: 8-22 mins        Sherryll Skoczylas, PT, GCS 01/30/23,9:46 AM

## 2023-01-30 NOTE — Discharge Summary (Addendum)
Physician Discharge Summary  Subjective: 1 Day Post-Op Procedure(s) (LRB): TOTAL HIP ARTHROPLASTY (Left) Patient reports pain as mild.   Patient seen in rounds with Dr. Ernest Pine. Patient is well, and has had no acute complaints or problems Patient is ready to go home  Physician Discharge Summary  Patient ID: Latoya Douglas MRN: [id number] DOB/AGE: 82-Oct-1942 82 y.o.  Admit date: 01/29/2023 Discharge date: 01/30/2023  Admission Diagnoses:  Discharge Diagnoses:  Principal Problem:   Hx of total hip arthroplasty, left   Discharged Condition: good  Hospital Course: Patient presented to the hospital on 01/29/2023 for an elective left total hip arthroplasty performed by Dr. Ernest Pine.  Patient was given 2 g of Ancef and one of the TXA perioperatively.  Patient tolerated the surgery well.  See operative details below.  Postoperatively, the patient was doing great.  Was able to tolerate working with physical therapy and passed her PT protocols postop day 1.  Patient had minimal drainage from her JP drain which was also pulled on postop day 1.  Vital signs are stable.  Patient's is stable for discharge.  PROCEDURE:  Left total hip arthroplasty   SURGEON:  Jena Gauss. M.D.   ASSISTANT:  Gean Birchwood, PA-C (present and scrubbed throughout the case, critical for assistance with exposure, retraction, instrumentation, and closure)   ANESTHESIA: spinal   ESTIMATED BLOOD LOSS: 75 mL   FLUIDS REPLACED: 850 mL of crystalloid   DRAINS: 2 medium Hemovac drains   IMPLANTS UTILIZED: DePuy size 3 high offset Actis femoral stem, 50 mm OD Pinnacle 100 acetabular component, +4 mm 10 degree Pinnacle Marathon polyethylene insert, and a 32 mm CoCr +1 mm hip ball  Treatments: None  Discharge Exam: Blood pressure (!) 131/48, pulse 64, temperature 97.7 F (36.5 C), temperature source Temporal, resp. rate 16, height 5\' 5"  (1.651 m), weight 65 kg, SpO2 96 %.   Disposition: Discharge disposition:  01-Home or Self Care     Home   Allergies as of 01/30/2023       Reactions   Tape Itching   Red and irritated        Medication List     TAKE these medications    acetaminophen 500 MG tablet Commonly known as: TYLENOL Take 1,000 mg by mouth 3 (three) times daily as needed.   amLODipine 2.5 MG tablet Commonly known as: NORVASC Take 2.5 mg by mouth 2 (two) times daily.   aspirin EC 81 MG tablet Take 1 tablet (81 mg total) by mouth in the morning and at bedtime. Swallow whole.   atorvastatin 40 MG tablet Commonly known as: LIPITOR Take 40 mg by mouth at bedtime.   BIOFREEZE EX Apply 1 Dose topically as needed (Left hip).   CALCIUM/VITAMIN D PO Take 1 tablet by mouth 2 (two) times daily. 600 mg Calcium +20 mcg Vitamin D   celecoxib 200 MG capsule Commonly known as: CELEBREX Take 1 capsule (200 mg total) by mouth 2 (two) times daily. What changed:  medication strength how much to take when to take this reasons to take this   clotrimazole-betamethasone cream Commonly known as: LOTRISONE Apply 1 Application topically as needed.   Ginkgo Biloba 60 MG Caps Take 1 capsule by mouth daily at 6 (six) AM.   hydrochlorothiazide 25 MG tablet Commonly known as: HYDRODIURIL Take 1 tablet by mouth every morning.   levothyroxine 75 MCG tablet Commonly known as: SYNTHROID Take 75 mcg by mouth daily before breakfast.   losartan 100 MG tablet  Commonly known as: COZAAR Take 1 tablet by mouth every morning.   MAXITROL OP Apply 1 drop to eye daily at 6 (six) AM.   Multi-Vitamins Tabs Take 1 tablet by mouth daily. 50+   oxyCODONE 5 MG immediate release tablet Commonly known as: Oxy IR/ROXICODONE Take 1 tablet (5 mg total) by mouth every 4 (four) hours as needed for moderate pain (pain score 4-6).   Pataday 0.2 % Soln Generic drug: Olopatadine HCl Place 1 drop into both eyes as needed.   SYSTANE BALANCE OP Place 1 drop into both eyes daily as needed. Both  eyes   traMADol 50 MG tablet Commonly known as: ULTRAM Take 1-2 tablets (50-100 mg total) by mouth every 4 (four) hours as needed for moderate pain.               Durable Medical Equipment  (From admission, onward)           Start     Ordered   01/29/23 1738  DME Walker rolling  Once       Question:  Patient needs a walker to treat with the following condition  Answer:  S/P total hip arthroplasty   01/29/23 1737   01/29/23 1738  DME Bedside commode  Once       Comments: Patient is not able to walk the distance required to go the bathroom, or he/she is unable to safely negotiate stairs required to access the bathroom.  A 3in1 BSC will alleviate this problem  Question:  Patient needs a bedside commode to treat with the following condition  Answer:  S/P total hip arthroplasty   01/29/23 1737            Follow-up Information     Hooten, Illene Labrador, MD Follow up on 03/13/2023.   Specialty: Orthopedic Surgery Why: at 2:15pm Contact information: [address] [facility] [place] [address] [phone]                 Signed: Gean Birchwood 01/30/2023, 8:43 AM   Objective: Vital signs in last 24 hours: Temp:  [97 F (36.1 C)-98.3 F (36.8 C)] 97.7 F (36.5 C) (06/04 0548) Pulse Rate:  [64-90] 64 (06/04 0548) Resp:  [9-22] 16 (06/04 0548) BP: (103-157)/(45-82) 131/48 (06/04 0548) SpO2:  [86 %-100 %] 96 % (06/04 0548) Weight:  [65 kg] 65 kg (06/03 0930)  Intake/Output from previous day:  Intake/Output Summary (Last 24 hours) at 01/30/2023 0843 Last data filed at 01/30/2023 0600 Gross per 24 hour  Intake 2904.05 ml  Output 1175 ml  Net 1729.05 ml    Intake/Output this shift: No intake/output data recorded.  Labs: No results for input(s): "HGB" in the last 72 hours. No results for input(s): "WBC", "RBC", "HCT", "PLT" in the last 72 hours. No results for input(s): "NA", "K", "CL", "CO2", "BUN", "CREATININE", "GLUCOSE", "CALCIUM" in the  last 72 hours. No results for input(s): "LABPT", "INR" in the last 72 hours.  EXAM: General - Patient is Alert, Appropriate, and Oriented Extremity - Neurologically intact ABD soft Neurovascular intact Sensation intact distally Intact pulses distally Dorsiflexion/Plantar flexion intact No cellulitis present Compartment soft Dressing - dressing C/D/I and no drainage Motor Function - intact, moving foot and toes well on exam.  Patient is able to dorsi and plantarflex with good range of motion and strength.  Patient is neurovascularly intact to all lower leg dermatomes.  Posterior tibial pulses appreciated 2+. JP drain pulled without difficulty.  Intact  Assessment/Plan:  1 Day Post-Op Procedure(s) (LRB): TOTAL HIP ARTHROPLASTY (Left) Procedure(s) (LRB): TOTAL HIP ARTHROPLASTY (Left) Past Medical History:  Diagnosis Date   Arthritis    Cancer (HCC)    skin cancer on forehead/face   Cough    post nasal drip   DVT (deep venous thrombosis) (HCC)    in arm (from IV)  "Years ago"   Frequent UTI    GERD (gastroesophageal reflux disease)    h/o   History of hiatal hernia    small   Hyperlipidemia    Hypertension    Hypothyroidism    Lumbar canal stenosis    Pneumonia    PONV (postoperative nausea and vomiting)    Pre-diabetes    UTI (urinary tract infection) 01/12/2023   took Cipro x 7 days   Principal Problem:   Hx of total hip arthroplasty, left  Estimated body mass index is 23.85 kg/m as calculated from the following:   Height as of this encounter: 5\' 5"  (1.651 m).   Weight as of this encounter: 65 kg.  Advance diet Patient has passed all of her PT protocols from the hospital and is cleared for discharge.  Continue to work with at home physical therapy.   DVT Prophylaxis - Aspirin 81 mg twice daily, TED hose on both legs Weight Bearing As Tolerated left leg JP drain pulled, intact Hip Preacutions discussed   Discussed the medications that patient will be sent  home with, including Celebrex for swelling and inflammation.  As well as tramadol and oxycodone for pain control.  Also discussed taking aspirin 81 mg twice a day for DVT prophylaxis.   Urged the patient to continue to wear her TED hose on both legs   Continue to use ice as needed to help with swelling and inflammation   Aquacel bandage stays in place for 6 more days.  Physical therapy will help change dressings at home.  Urged the patient to call if she has any concerns  Disposition - Home Condition Upon Discharge - Good Patient will follow-up with [facility] orthopedics in 6 weeks for reevaluation and reimaging.  Danise Edge, PA-C Orthopaedic Surgery 01/30/2023, 8:43 AM

## 2023-01-30 NOTE — Progress Notes (Signed)
Subjective: 1 Day Post-Op Procedure(s) (LRB): TOTAL HIP ARTHROPLASTY (Left) Patient reports pain as mild.   Patient seen in rounds with Dr. Ernest Pine. Patient is well, and has had no acute complaints or problems We will start therapy today.  Plan is to go Home after hospital stay.  Objective: Vital signs in last 24 hours: Temp:  [97 F (36.1 C)-98.3 F (36.8 C)] 97.7 F (36.5 C) (06/04 0548) Pulse Rate:  [64-90] 64 (06/04 0548) Resp:  [9-22] 16 (06/04 0548) BP: (103-157)/(45-82) 131/48 (06/04 0548) SpO2:  [86 %-100 %] 96 % (06/04 0548) Weight:  [65 kg] 65 kg (06/03 0930)  Intake/Output from previous day:  Intake/Output Summary (Last 24 hours) at 01/30/2023 0802 Last data filed at 01/30/2023 0600 Gross per 24 hour  Intake 2904.05 ml  Output 1175 ml  Net 1729.05 ml    Intake/Output this shift: No intake/output data recorded.  Labs: No results for input(s): "HGB" in the last 72 hours. No results for input(s): "WBC", "RBC", "HCT", "PLT" in the last 72 hours. No results for input(s): "NA", "K", "CL", "CO2", "BUN", "CREATININE", "GLUCOSE", "CALCIUM" in the last 72 hours. No results for input(s): "LABPT", "INR" in the last 72 hours.  EXAM General - Patient is Alert, Appropriate, and Oriented Extremity - Neurologically intact ABD soft Neurovascular intact Sensation intact distally Intact pulses distally Dorsiflexion/Plantar flexion intact No cellulitis present Compartment soft Dressing - dressing C/D/I and no drainage Motor Function - intact, moving foot and toes well on exam.  Patient is able to dorsi and plantarflex with good range of motion and strength.  Patient is neurovascularly intact to all lower leg dermatomes.  Posterior tibial pulses appreciated 2+. JP drain pulled without difficulty.  Intact  Past Medical History:  Diagnosis Date   Arthritis    Cancer (HCC)    skin cancer on forehead/face   Cough    post nasal drip   DVT (deep venous thrombosis) (HCC)    in  arm (from IV)  "Years ago"   Frequent UTI    GERD (gastroesophageal reflux disease)    h/o   History of hiatal hernia    small   Hyperlipidemia    Hypertension    Hypothyroidism    Lumbar canal stenosis    Pneumonia    PONV (postoperative nausea and vomiting)    Pre-diabetes    UTI (urinary tract infection) 01/12/2023   took Cipro x 7 days    Assessment/Plan: 1 Day Post-Op Procedure(s) (LRB): TOTAL HIP ARTHROPLASTY (Left) Principal Problem:   Hx of total hip arthroplasty, left  Estimated body mass index is 23.85 kg/m as calculated from the following:   Height as of this encounter: 5\' 5"  (1.651 m).   Weight as of this encounter: 65 kg. Advance diet Up with therapy -once patient is able to pass her PT protocols, she will be stable for discharge.  DVT Prophylaxis - Aspirin 81 mg twice daily, TED hose, SCDs Weight Bearing As Tolerated left leg JP drain pulled Begin Therapy Hip Preacutions discussed  Discussed the medications that patient will be sent home with, including Celebrex for swelling and inflammation.  As well as tramadol and oxycodone for pain control.  Also discussed taking aspirin 81 mg twice a day for DVT prophylaxis.  Urged the patient to continue to wear her TED hose on both legs  Continue to use ice as needed to help with swelling and inflammation  Plan to work with physical therapy at home upon discharge.  Aquacel bandage stays  in place for 6 more days.  Physical therapy will help change dressings at home.  Urged the patient to call if she has any concerns  Rayburn Go, PA-C [facility] Orthopa[facility] 01/30/2023, 8:02 AM

## 2023-01-30 NOTE — Evaluation (Signed)
Occupational Therapy Evaluation Patient Details Name: Latoya Douglas MRN: [id number] DOB: [birth year] Today's Date: 01/30/2023   History of Present Illness Latoya Douglas is a 82 y.o. female who has severe left hip pain and has failed conservative treatment. S/p L THA with Dr. Ernest Pine on 01/29/23.   Clinical Impression   Latoya Douglas was seen for OT evaluation this date. Prior to hospital admission, pt was MOD I using SPC. 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 Pt currently requires SUPERVISION + RW toilet t/f and pericare. MOD I standing grooming tasks. SBA don underwear/pants using reacher, cues to maintain posterior hip pcns. Pt educated on functional application of posterior hip pcns and DME recs. All education completed, will sign off. Upon hospital discharge, recommend no OT follow up.   Recommendations for follow up therapy are one component of a multi-disciplinary discharge planning process, led by the attending physician.  Recommendations may be updated based on patient status, additional functional criteria and insurance authorization.   Assistance Recommended at Discharge Intermittent Supervision/Assistance  Patient can return home with the following A little help with walking and/or transfers;A little help with bathing/dressing/bathroom    Functional Status Assessment  Patient has had a recent decline in their functional status and demonstrates the ability to make significant improvements in function in a reasonable and predictable amount of time.  Equipment Recommendations  None recommended by OT    Recommendations for Other Services       Precautions / Restrictions Precautions Precautions: Posterior Hip;Fall Precaution Booklet Issued: Yes (comment) Restrictions Weight Bearing Restrictions: Yes LLE Weight Bearing: Weight bearing as tolerated      Mobility Bed Mobility Overal bed mobility: Modified Independent                   Transfers Overall transfer level: Needs assistance Equipment used: Rolling walker (2 wheels) Transfers: Sit to/from Stand Sit to Stand: Supervision                  Balance Overall balance assessment: No apparent balance deficits (not formally assessed)                                         ADL either performed or assessed with clinical judgement   ADL Overall ADL's : Needs assistance/impaired                                       General ADL Comments: SUPERVISION + RW toilet t/f and pericare. MOD I standing grooming tasks. SBA don underwear/pants using reacher, cues to maintain posterior hip pcns      Pertinent Vitals/Pain Pain Assessment Pain Assessment: 0-10 Pain Score: 5  Pain Location: L hip Pain Descriptors / Indicators: Discomfort, Dull Pain Intervention(s): Limited activity within patient's tolerance, Repositioned, Ice applied     Hand Dominance Left   Extremity/Trunk Assessment Upper Extremity Assessment Upper Extremity Assessment: Defer to OT evaluation   Lower Extremity Assessment Lower Extremity Assessment: LLE deficits/detail LLE Sensation: WNL LLE Coordination: decreased gross motor   Cervical / Trunk Assessment Cervical / Trunk Assessment: Normal   Communication Communication Communication: No difficulties   Cognition Arousal/Alertness: Awake/alert Behavior During Therapy: WFL for tasks assessed/performed Overall Cognitive Status: Within Functional Limits for tasks assessed  Home Living Family/patient expects to be discharged to:: Private residence Living Arrangements: Spouse/significant other;Children Available Help at Discharge: Family;Available 24 hours/day Type of Home: House Home Access: Stairs to enter [employer] of Steps: 4 Entrance Stairs-Rails: Right;Left;Can reach both Home Layout: One level          Bathroom Toilet: Handicapped height     Home Equipment: Agricultural consultant (2 wheels)   Additional Comments: spouse has dementia      Prior Functioning/Environment Prior Level of Function : Independent/Modified Independent;Driving             Mobility Comments: MOD I using SPC 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        OT Problem List: Decreased range of motion;Decreased activity tolerance;Decreased knowledge of precautions         OT Goals(Current goals can be found in the care plan section) Acute Rehab OT Goals Patient Stated Goal: to go home OT Goal Formulation: With patient Time For Goal Achievement: 02/13/23 Potential to Achieve Goals: Good   AM-PAC OT "6 Clicks" Daily Activity     Outcome Measure Help from another person eating meals?: None Help from another person taking care of personal grooming?: None Help from another person toileting, which includes using toliet, bedpan, or urinal?: None Help from another person bathing (including washing, rinsing, drying)?: A Little Help from another person to put on and taking off regular upper body clothing?: None Help from another person to put on and taking off regular lower body clothing?: A Little 6 Click Score: 22   End of Session Equipment Utilized During Treatment: Rolling walker (2 wheels)  Activity Tolerance: Patient tolerated treatment well Patient left: in bed;with call bell/phone within reach  OT Visit Diagnosis: Unsteadiness on feet (R26.81);Muscle weakness (generalized) (M62.81)                Time: 1610-9604 OT Time Calculation (min): 25 min Charges:  OT General Charges $OT Visit: 1 Visit OT Evaluation $OT Eval Low Complexity: 1 Low OT Treatments $Self Care/Home Management : 8-22 mins  Kathie Dike, M.S. OTR/L  01/30/23, 9:49 AM  ascom [phone]

## 2023-02-05 LAB — SURGICAL PATHOLOGY
